# Patient Record
Sex: Female | Born: 1975
Health system: Southern US, Community
[De-identification: ages and names within clinical notes are randomized; demographics above are authoritative.]

## PROBLEM LIST (undated history)

## (undated) DIAGNOSIS — E119 Type 2 diabetes mellitus without complications: Secondary | ICD-10-CM

## (undated) DIAGNOSIS — L209 Atopic dermatitis, unspecified: Secondary | ICD-10-CM

## (undated) DIAGNOSIS — I10 Essential (primary) hypertension: Secondary | ICD-10-CM

## (undated) DIAGNOSIS — E559 Vitamin D deficiency, unspecified: Secondary | ICD-10-CM

## (undated) DIAGNOSIS — F17200 Nicotine dependence, unspecified, uncomplicated: Secondary | ICD-10-CM

## (undated) HISTORY — DX: Type 2 diabetes mellitus without complications: E11.9

## (undated) HISTORY — DX: Vitamin D deficiency, unspecified: E55.9

## (undated) HISTORY — DX: Nicotine dependence, unspecified, uncomplicated: F17.200

## (undated) HISTORY — PX: FINGER GANGLION CYST EXCISION: SHX1636

## (undated) HISTORY — DX: Essential (primary) hypertension: I10

## (undated) HISTORY — DX: Atopic dermatitis, unspecified: L20.9

---
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
## 1998-01-14 ENCOUNTER — Other Ambulatory Visit: Admission: RE | Admit: 1998-01-14 | Discharge: 1998-01-14 | Payer: Self-pay | Admitting: Obstetrics and Gynecology

## 2004-11-07 ENCOUNTER — Ambulatory Visit (HOSPITAL_COMMUNITY): Admission: RE | Admit: 2004-11-07 | Discharge: 2004-11-07 | Payer: Self-pay | Admitting: Obstetrics & Gynecology

## 2004-12-30 ENCOUNTER — Ambulatory Visit (HOSPITAL_COMMUNITY): Admission: RE | Admit: 2004-12-30 | Discharge: 2004-12-30 | Payer: Self-pay | Admitting: Obstetrics & Gynecology

## 2005-03-17 ENCOUNTER — Ambulatory Visit (HOSPITAL_COMMUNITY): Admission: RE | Admit: 2005-03-17 | Discharge: 2005-03-17 | Payer: Self-pay | Admitting: Obstetrics

## 2005-04-08 ENCOUNTER — Inpatient Hospital Stay (HOSPITAL_COMMUNITY): Admission: AD | Admit: 2005-04-08 | Discharge: 2005-04-08 | Payer: Self-pay | Admitting: Obstetrics & Gynecology

## 2005-05-04 ENCOUNTER — Ambulatory Visit (HOSPITAL_COMMUNITY): Admission: RE | Admit: 2005-05-04 | Discharge: 2005-05-04 | Payer: Self-pay | Admitting: Obstetrics

## 2005-05-06 ENCOUNTER — Inpatient Hospital Stay (HOSPITAL_COMMUNITY): Admission: AD | Admit: 2005-05-06 | Discharge: 2005-05-06 | Payer: Self-pay | Admitting: Obstetrics

## 2005-05-11 ENCOUNTER — Ambulatory Visit (HOSPITAL_COMMUNITY): Admission: RE | Admit: 2005-05-11 | Discharge: 2005-05-11 | Payer: Self-pay | Admitting: Obstetrics

## 2005-05-13 ENCOUNTER — Inpatient Hospital Stay (HOSPITAL_COMMUNITY): Admission: AD | Admit: 2005-05-13 | Discharge: 2005-05-16 | Payer: Self-pay | Admitting: Obstetrics

## 2008-09-05 ENCOUNTER — Encounter: Admission: RE | Admit: 2008-09-05 | Discharge: 2008-09-05 | Payer: Self-pay | Admitting: Internal Medicine

## 2010-01-08 ENCOUNTER — Encounter: Admission: RE | Admit: 2010-01-08 | Discharge: 2010-01-29 | Payer: Self-pay | Admitting: Obstetrics and Gynecology

## 2010-01-14 ENCOUNTER — Encounter: Admission: RE | Admit: 2010-01-14 | Discharge: 2010-01-14 | Payer: Self-pay | Admitting: Certified Nurse Midwife

## 2010-03-18 ENCOUNTER — Inpatient Hospital Stay (HOSPITAL_COMMUNITY): Admission: AD | Admit: 2010-03-18 | Discharge: 2010-03-21 | Payer: Self-pay | Admitting: Obstetrics and Gynecology

## 2010-11-01 ENCOUNTER — Encounter: Payer: Self-pay | Admitting: Obstetrics & Gynecology

## 2010-11-02 ENCOUNTER — Encounter: Payer: Self-pay | Admitting: Obstetrics

## 2010-12-29 LAB — COMPREHENSIVE METABOLIC PANEL
ALT: 22 U/L (ref 0–35)
AST: 28 U/L (ref 0–37)
Albumin: 2.3 g/dL — ABNORMAL LOW (ref 3.5–5.2)
Alkaline Phosphatase: 57 U/L (ref 39–117)
BUN: 17 mg/dL (ref 6–23)
CO2: 26 mEq/L (ref 19–32)
Calcium: 9.8 mg/dL (ref 8.4–10.5)
Chloride: 108 mEq/L (ref 96–112)
Creatinine, Ser: 1.04 mg/dL (ref 0.4–1.2)
GFR calc Af Amer: >60 mL/min (ref >60)
GFR calc non Af Amer: >60 mL/min (ref >60)
Glucose, Bld: 73 mg/dL (ref 70–99)
Potassium: 4.1 mEq/L (ref 3.5–5.1)
Sodium: 140 mEq/L (ref 135–145)
Total Bilirubin: 0.2 mg/dL — ABNORMAL LOW (ref 0.3–1.2)
Total Protein: 4.9 g/dL — ABNORMAL LOW (ref 6.0–8.3)

## 2010-12-29 LAB — CBC
HCT: 32.9 % — ABNORMAL LOW (ref 36.0–46.0)
HCT: 40.4 % (ref 36.0–46.0)
Hemoglobin: 11.3 g/dL — ABNORMAL LOW (ref 12.0–15.0)
Hemoglobin: 13.7 g/dL (ref 12.0–15.0)
MCHC: 33.9 g/dL (ref 30.0–36.0)
MCHC: 34.4 g/dL (ref 30.0–36.0)
MCV: 83.1 fL (ref 78.0–100.0)
MCV: 83.5 fL (ref 78.0–100.0)
Platelets: 163 10*3/uL (ref 150–400)
Platelets: 196 10*3/uL (ref 150–400)
RBC: 3.94 MIL/uL (ref 3.87–5.11)
RBC: 4.86 MIL/uL (ref 3.87–5.11)
RDW: 14.7 % (ref 11.5–15.5)
RDW: 15.2 % (ref 11.5–15.5)
WBC: 10.8 10*3/uL — ABNORMAL HIGH (ref 4.0–10.5)
WBC: 13.6 10*3/uL — ABNORMAL HIGH (ref 4.0–10.5)

## 2010-12-29 LAB — GLUCOSE, CAPILLARY
Glucose-Capillary: 179 mg/dL — ABNORMAL HIGH (ref 70–99)
Glucose-Capillary: 80 mg/dL (ref 70–99)

## 2010-12-29 LAB — RPR: RPR Ser Ql: NONREACTIVE

## 2010-12-29 LAB — URIC ACID: Uric Acid, Serum: 6.4 mg/dL (ref 2.4–7.0)

## 2013-09-18 ENCOUNTER — Encounter: Payer: Self-pay | Admitting: Advanced Practice Midwife

## 2014-08-10 ENCOUNTER — Encounter: Payer: Self-pay | Admitting: Family Medicine

## 2014-08-10 ENCOUNTER — Encounter (INDEPENDENT_AMBULATORY_CARE_PROVIDER_SITE_OTHER): Payer: Self-pay

## 2014-08-10 ENCOUNTER — Ambulatory Visit (INDEPENDENT_AMBULATORY_CARE_PROVIDER_SITE_OTHER): Payer: BC Managed Care – PPO | Admitting: Family Medicine

## 2014-08-10 ENCOUNTER — Ambulatory Visit
Admission: RE | Admit: 2014-08-10 | Discharge: 2014-08-10 | Disposition: A | Discharge status: Home / Self Care | Payer: BC Managed Care – PPO | Source: Ambulatory Visit | Attending: Family Medicine | Admitting: Family Medicine

## 2014-08-10 VITALS — BP 126/88 | HR 83 | Ht 65.0 in | Wt 140.0 lb

## 2014-08-10 DIAGNOSIS — G5791 Unspecified mononeuropathy of right lower limb: Secondary | ICD-10-CM

## 2014-08-10 DIAGNOSIS — I10 Essential (primary) hypertension: Secondary | ICD-10-CM

## 2014-08-10 DIAGNOSIS — G5790 Unspecified mononeuropathy of unspecified lower limb: Secondary | ICD-10-CM | POA: Insufficient documentation

## 2014-08-10 LAB — CBC WITH DIFFERENTIAL/PLATELET
Basophils Absolute: 0.1 10*3/uL (ref 0.0–0.1)
Basophils Relative: 1 % (ref 0–1)
Eosinophils Absolute: 0.1 10*3/uL (ref 0.0–0.7)
Eosinophils Relative: 1 % (ref 0–5)
HCT: 43 % (ref 36.0–46.0)
Hemoglobin: 14.1 g/dL (ref 12.0–15.0)
Lymphocytes Relative: 25 % (ref 12–46)
Lymphs Abs: 2 10*3/uL (ref 0.7–4.0)
MCH: 25.8 pg — ABNORMAL LOW (ref 26.0–34.0)
MCHC: 32.8 g/dL (ref 30.0–36.0)
MCV: 78.6 fL (ref 78.0–100.0)
Monocytes Absolute: 0.7 10*3/uL (ref 0.1–1.0)
Monocytes Relative: 9 % (ref 3–12)
Neutro Abs: 5 10*3/uL (ref 1.7–7.7)
Neutrophils Relative %: 64 % (ref 43–77)
Platelets: 294 10*3/uL (ref 150–400)
RBC: 5.47 MIL/uL — ABNORMAL HIGH (ref 3.87–5.11)
RDW: 14.1 % (ref 11.5–15.5)
WBC: 7.8 10*3/uL (ref 4.0–10.5)

## 2014-08-10 LAB — BASIC METABOLIC PANEL
BUN: 15 mg/dL (ref 6–23)
CO2: 22 mEq/L (ref 19–32)
Calcium: 9.5 mg/dL (ref 8.4–10.5)
Chloride: 102 mEq/L (ref 96–112)
Creat: 0.82 mg/dL (ref 0.50–1.10)
Glucose, Bld: 69 mg/dL — ABNORMAL LOW (ref 70–99)
Potassium: 4 mEq/L (ref 3.5–5.3)
Sodium: 139 mEq/L (ref 135–145)

## 2014-08-10 LAB — TSH: TSH: 0.519 u[IU]/mL (ref 0.350–4.500)

## 2014-08-10 MED ORDER — VALACYCLOVIR HCL 1 G PO TABS: 1000.0000 mg | ORAL_TABLET | Freq: Three times a day (TID) | ORAL | Status: DC

## 2014-08-10 MED ORDER — GABAPENTIN 100 MG PO CAPS: ORAL_CAPSULE | ORAL | Status: DC

## 2014-08-10 NOTE — Patient Instructions (Signed)
Take by tablet by mouth Day 1-3:   take one at night  Day 4-6:  Take 2 at night Day 7-10:  take 3 at night Day 11-14:  3 at night, 1 in am Day 15-18:   3 at night, 2 in am Day 19-21: 3 at night, 3 in am Day 22-25: 3 at night, 3 in am, 2 at lunch Day 26 forward : 3 at night, 3 in am, 3 at lunch  I have given you a rx for valtrex in case you break out in a rash

## 2014-08-11 LAB — SEDIMENTATION RATE: Sed Rate: 1 mm/hr (ref 0–22)

## 2014-08-13 ENCOUNTER — Encounter: Payer: Self-pay | Admitting: Family Medicine

## 2014-08-13 NOTE — Assessment & Plan Note (Signed)
Certainly the clinical picture is consistent with some type of neuropathy. Herpes zoster would be most common cause of this but there is no sign of lesion. I did give her a prescription of Valtrex in case she develops rash over the weekend. I will get x-rays to make sure we are not missing some type of  Trauma or bone pathology. I'll start her on gabapentin. I will also check basic labs looking for diabetes, thyroid issues, unusual infectious causes. I will see her back in 2 weeks. She will call or come in if things worsen or she has new symptoms in the meantime. We also discussed continuing use of the compression stocking because I think it is helping with the hyperesthesia, and we discussed possibly using capsaicin cream over-the-counter as a counter irritant

## 2014-08-13 NOTE — Progress Notes (Signed)
Patient ID: Cheryl Hill, female   DOB: 07/30/1976, 38 y.o.   MRN: [id number]  Cheryl RudJennifer G Markes - 38 y.o. female MRN [id number]  Date of birth: 07/04/1976    SUBJECTIVE:     Right anterior shin and right top of the foot pain came on acutely and has continued for the last 4-5 days. No injury that was noted. Has never had anything like this before. Has been wearing a supportive stocking which seems to help some with the hyperesthesia. Has had no rash and noticed no redness. Did seem like the chin was a little bit swollen initially but not as much now. Pain is intense, 9- 10 out of 10, worse with any type of contact with the area.has noted no weakness of the leg. Was seen at urgent care where they did Doppler studies to rule out venous clot. Reportedly those are negative. ROS:     No rash, no unusual weight change, no fever, sweats, chills. She's had no knee swelling no ankle swelling or any unusual arthralgias other than the anterior shin area. She has not been more fatigued than usual, no unusual thirst, no cold or heat intolerance. No unusual bruising, no nausea, no GI upset. No petechiae  PERTINENT  PMH / PSH FH / / SH:  Past Medical, Surgical, Social, and Family History Reviewed & Updated in the EMR.  Pertinent findings include:  She street for hypertension. At one time she had some borderline thyroid functions but is not currently on replacement therapy. Nonsmoker. Her job is sedentary. Never smoker. OBJECTIVE: BP 126/88 mmHg  Pulse 83  Ht 5\' 5"  (1.651 m)  Wt 140 lb (63.504 kg)  BMI 23.30 kg/m2  Physical Exam:  Vital signs are reviewed. GEN.: Well-developed female no acute distress LEG: Bilaterally her lower extremities are symmetrical.her pain is in the right shin and the right foot dorsum and there is no evidence of erythema, no lesions, no rash, no notable swelling. She is hyperesthesia to soft touch sensation in a very discrete location mid shin and then again in a noncontiguous location  on the dorsum of her foot that does not extend into her toes. ANKLE: Full range of motion. Nontender. No effusion. KNEE: No effusion. No erythema. Range of motion flexion distention. Ligamentously intact. No joint line tenderness. The calf is soft. The popliteal space is benign. Notably on her ultrasound she was told she had a mild Baker's cyst but I do not palpate significant fullness in the popliteal space. Neuro: Soft touch sensation in 2 point discrimination in bilateral lower extremities is normal other than a area of hyperesthesia in the mid anterior shin that is an area about 4 cm x 3 cm and then a similar area on the dorsum of the foot not including the ankle and not including the toes and area 2 x 4 cm. VASCULAR: Dorsalis pedis pulses are 2+ bilaterally equal as are posterior tibialis pulses.  ASSESSMENT & PLAN:  See problem based charting & AVS for pt instructions.

## 2014-08-24 ENCOUNTER — Ambulatory Visit (INDEPENDENT_AMBULATORY_CARE_PROVIDER_SITE_OTHER): Payer: BC Managed Care – PPO | Admitting: Family Medicine

## 2014-08-24 ENCOUNTER — Encounter: Payer: Self-pay | Admitting: Family Medicine

## 2014-08-24 VITALS — BP 135/96 | Ht 65.0 in | Wt 140.0 lb

## 2014-08-24 DIAGNOSIS — G5791 Unspecified mononeuropathy of right lower limb: Secondary | ICD-10-CM | POA: Diagnosis not present

## 2014-08-24 NOTE — Assessment & Plan Note (Signed)
Very confusing picture. All of her lab work including her sedimentation rate and x-ray were normal. At this point I would continue to taper the gabapentin up but at a slower rate than we have discussed this. I think the next step would be to do an MRI of the tibial fibular area. As this comes with quite a high co-pay she's a little concerned about pursuing this. We discussed at length. The other alternatives continue to taper the gabapentin and follow this. She's will think about every the weekend and let me know.  Initially thought this was some type of prodromal neuropathy that was likely going be herpes zoster. She has not had any rash however. She does have some soft tissue swelling. Her sedimentation rate was 1 which makes you think this is not rheumatological. Differential includes idiopathic stress fracture, infectious process although I doubt that given her sedimentation rate lack of fever and lack of erythema. Could also include some unusual neurological conditions which multiple sclerosis is always a component.

## 2014-08-24 NOTE — Progress Notes (Signed)
   Subjective:    Patient ID: Cheryl Hill, female    DOB: 04/26/1976, 38 y.o.   MRN: [id number]  HPI Follow-up right lower extremity pain. She did not break out in a rash. Her symptoms have not resolved. She has continued to use the compression stocking which gives her some mild relief from the hyperesthesia. She has tapered the gabapentin up to 55 mg a day is having some dizziness with that. She's had no other unusual symptoms.   Review of Systems No fever, sweats, chills. No other arthralgias or myalgias. No unusual weight loss.    Objective:   Physical Exam Vital signs are reviewed GENERAL: Well-developed female no acute distress EXTREMITY: Right lower leg is tender to palpation over the shin. There is no deformity. There is some mild soft tissue swelling there. The dorsum of the foot is quite swollen today and she has hyperesthesia there as well. She is also tender palpation along the posterior tibial tendon but she has intact strength. Calf is soft. She has 4 range of motion of the knee and ankle and midfoot.       Assessment & Plan:

## 2014-08-31 ENCOUNTER — Ambulatory Visit: Payer: BC Managed Care – PPO | Admitting: Family Medicine

## 2014-09-11 ENCOUNTER — Ambulatory Visit: Payer: Medicaid Other | Admitting: Obstetrics

## 2015-03-27 ENCOUNTER — Encounter: Payer: Self-pay | Admitting: *Deleted

## 2015-04-24 ENCOUNTER — Encounter: Payer: Self-pay | Admitting: Obstetrics & Gynecology

## 2015-08-15 ENCOUNTER — Encounter: Payer: Self-pay | Admitting: Family Medicine

## 2015-11-22 ENCOUNTER — Encounter: Payer: Self-pay | Admitting: Obstetrics & Gynecology

## 2015-12-18 ENCOUNTER — Encounter: Payer: Self-pay | Admitting: Obstetrics & Gynecology

## 2016-02-03 DIAGNOSIS — L74510 Primary focal hyperhidrosis, axilla: Secondary | ICD-10-CM | POA: Diagnosis not present

## 2016-02-03 DIAGNOSIS — I10 Essential (primary) hypertension: Secondary | ICD-10-CM | POA: Diagnosis not present

## 2016-02-03 DIAGNOSIS — L219 Seborrheic dermatitis, unspecified: Secondary | ICD-10-CM | POA: Diagnosis not present

## 2016-02-03 DIAGNOSIS — Z0000 Encounter for general adult medical examination without abnormal findings: Secondary | ICD-10-CM | POA: Diagnosis not present

## 2016-02-10 ENCOUNTER — Other Ambulatory Visit: Payer: Self-pay

## 2016-02-10 DIAGNOSIS — Z1231 Encounter for screening mammogram for malignant neoplasm of breast: Secondary | ICD-10-CM

## 2016-02-17 DIAGNOSIS — L74510 Primary focal hyperhidrosis, axilla: Secondary | ICD-10-CM | POA: Diagnosis not present

## 2016-02-17 DIAGNOSIS — L219 Seborrheic dermatitis, unspecified: Secondary | ICD-10-CM | POA: Diagnosis not present

## 2016-03-30 DIAGNOSIS — L74510 Primary focal hyperhidrosis, axilla: Secondary | ICD-10-CM | POA: Diagnosis not present

## 2016-03-30 DIAGNOSIS — L219 Seborrheic dermatitis, unspecified: Secondary | ICD-10-CM | POA: Diagnosis not present

## 2016-03-30 DIAGNOSIS — L309 Dermatitis, unspecified: Secondary | ICD-10-CM | POA: Diagnosis not present

## 2016-06-01 ENCOUNTER — Ambulatory Visit: Payer: Self-pay

## 2016-06-08 ENCOUNTER — Ambulatory Visit: Payer: Self-pay

## 2016-06-08 ENCOUNTER — Ambulatory Visit
Admission: RE | Admit: 2016-06-08 | Discharge: 2016-06-08 | Disposition: A | Discharge status: Home / Self Care | Payer: BLUE CROSS/BLUE SHIELD | Source: Ambulatory Visit

## 2016-06-08 DIAGNOSIS — Z1231 Encounter for screening mammogram for malignant neoplasm of breast: Secondary | ICD-10-CM | POA: Diagnosis not present

## 2016-06-18 ENCOUNTER — Other Ambulatory Visit: Payer: Self-pay | Admitting: Internal Medicine

## 2016-06-18 DIAGNOSIS — R928 Other abnormal and inconclusive findings on diagnostic imaging of breast: Secondary | ICD-10-CM

## 2016-06-23 ENCOUNTER — Ambulatory Visit
Admission: RE | Admit: 2016-06-23 | Discharge: 2016-06-23 | Disposition: A | Discharge status: Home / Self Care | Payer: BLUE CROSS/BLUE SHIELD | Source: Ambulatory Visit | Attending: Internal Medicine | Admitting: Internal Medicine

## 2016-06-23 ENCOUNTER — Other Ambulatory Visit: Payer: Self-pay

## 2016-06-23 DIAGNOSIS — R928 Other abnormal and inconclusive findings on diagnostic imaging of breast: Secondary | ICD-10-CM

## 2016-06-23 DIAGNOSIS — D3131 Benign neoplasm of right choroid: Secondary | ICD-10-CM | POA: Diagnosis not present

## 2016-06-23 DIAGNOSIS — R922 Inconclusive mammogram: Secondary | ICD-10-CM | POA: Diagnosis not present

## 2016-07-06 DIAGNOSIS — J00 Acute nasopharyngitis [common cold]: Secondary | ICD-10-CM | POA: Diagnosis not present

## 2016-07-06 DIAGNOSIS — I10 Essential (primary) hypertension: Secondary | ICD-10-CM | POA: Diagnosis not present

## 2016-07-06 DIAGNOSIS — J029 Acute pharyngitis, unspecified: Secondary | ICD-10-CM | POA: Diagnosis not present

## 2016-07-17 DIAGNOSIS — Z3202 Encounter for pregnancy test, result negative: Secondary | ICD-10-CM | POA: Diagnosis not present

## 2016-07-17 DIAGNOSIS — Z30017 Encounter for initial prescription of implantable subdermal contraceptive: Secondary | ICD-10-CM | POA: Diagnosis not present

## 2016-07-17 DIAGNOSIS — Z6823 Body mass index (BMI) 23.0-23.9, adult: Secondary | ICD-10-CM | POA: Diagnosis not present

## 2016-07-17 DIAGNOSIS — Z01419 Encounter for gynecological examination (general) (routine) without abnormal findings: Secondary | ICD-10-CM | POA: Diagnosis not present

## 2016-07-22 DIAGNOSIS — Z23 Encounter for immunization: Secondary | ICD-10-CM | POA: Diagnosis not present

## 2016-08-10 DIAGNOSIS — L219 Seborrheic dermatitis, unspecified: Secondary | ICD-10-CM | POA: Diagnosis not present

## 2016-08-10 DIAGNOSIS — R7309 Other abnormal glucose: Secondary | ICD-10-CM | POA: Diagnosis not present

## 2016-08-10 DIAGNOSIS — I10 Essential (primary) hypertension: Secondary | ICD-10-CM | POA: Diagnosis not present

## 2016-08-10 DIAGNOSIS — M79671 Pain in right foot: Secondary | ICD-10-CM | POA: Diagnosis not present

## 2016-08-10 DIAGNOSIS — H5213 Myopia, bilateral: Secondary | ICD-10-CM | POA: Diagnosis not present

## 2016-08-10 DIAGNOSIS — H16103 Unspecified superficial keratitis, bilateral: Secondary | ICD-10-CM | POA: Diagnosis not present

## 2016-08-10 DIAGNOSIS — H04123 Dry eye syndrome of bilateral lacrimal glands: Secondary | ICD-10-CM | POA: Diagnosis not present

## 2016-09-18 DIAGNOSIS — M65872 Other synovitis and tenosynovitis, left ankle and foot: Secondary | ICD-10-CM | POA: Diagnosis not present

## 2016-09-18 DIAGNOSIS — M65871 Other synovitis and tenosynovitis, right ankle and foot: Secondary | ICD-10-CM | POA: Diagnosis not present

## 2016-11-04 DIAGNOSIS — J111 Influenza due to unidentified influenza virus with other respiratory manifestations: Secondary | ICD-10-CM | POA: Diagnosis not present

## 2016-11-10 DIAGNOSIS — L209 Atopic dermatitis, unspecified: Secondary | ICD-10-CM

## 2016-11-10 HISTORY — DX: Atopic dermatitis, unspecified: L20.9

## 2016-12-08 ENCOUNTER — Ambulatory Visit (INDEPENDENT_AMBULATORY_CARE_PROVIDER_SITE_OTHER): Payer: BLUE CROSS/BLUE SHIELD | Admitting: Obstetrics and Gynecology

## 2016-12-08 ENCOUNTER — Encounter: Payer: Self-pay | Admitting: Obstetrics and Gynecology

## 2016-12-08 VITALS — BP 172/96 | HR 90 | Temp 97.5°F | Wt 117.0 lb

## 2016-12-08 DIAGNOSIS — I10 Essential (primary) hypertension: Secondary | ICD-10-CM

## 2016-12-08 DIAGNOSIS — Z202 Contact with and (suspected) exposure to infections with a predominantly sexual mode of transmission: Secondary | ICD-10-CM | POA: Insufficient documentation

## 2016-12-08 DIAGNOSIS — Z0000 Encounter for general adult medical examination without abnormal findings: Secondary | ICD-10-CM | POA: Diagnosis not present

## 2016-12-08 DIAGNOSIS — Z01419 Encounter for gynecological examination (general) (routine) without abnormal findings: Secondary | ICD-10-CM | POA: Diagnosis not present

## 2016-12-08 NOTE — Progress Notes (Signed)
Patient presents for her Annual exam.  Cheryl Hill is a 41 y.o.  She has no GYN complaints except for a rash involving her groin. States has had before and was prescribed a cream. Rash went away but has returned a few months ago. Denies any exposure and only occasional itching.  She is sexual active with same partner x 5 years. No contraception. Monthly cycles last 3 days. Last pap over 10 yrs ago. Denies H/O abnormal pap smear or STD's  [id number] EAB x 2 TSVD x 1 and C section x 1  HTN and is followed by PCP. Has not taken her BP meds for the last 2 days. No particular reason.      Denies abnormal vaginal bleeding, discharge, pelvic pain, problems with intercourse or other gynecologic concerns.    Gynecologic History Patient's last menstrual period was 11/13/2016 (exact date). Contraception: none Last Pap: over 10 yrs ago. Results were: normal Last mammogram: NA. Results were: NA  Obstetric History OB History  No data available    Past Medical History:  Diagnosis Date  . Atopic dermatitis 11/10/2016  . Diabetes mellitus without complication (HCC)   . Hypertension     No past surgical history on file.  Current Outpatient Prescriptions on File Prior to Visit  Medication Sig Dispense Refill  . lisinopril (PRINIVIL,ZESTRIL) 10 MG tablet Take 1 mg by mouth daily.    . sitaGLIPtin (JANUVIA) 100 MG tablet Take 100 mg by mouth daily.    Marland Kitchen aspirin 81 MG tablet Take 81 mg by mouth 2 (two) times daily.    Marland Kitchen gabapentin (NEURONTIN) 100 MG capsule Take by mouth patient has written taper (Patient not taking: Reported on 12/08/2016) 90 capsule 0  . losartan (COZAAR) 25 MG tablet Take 25 mg by mouth daily.    Maudry Mayhew AD 4-10-325 MG TABS   0  . traMADol (ULTRAM) 50 MG tablet   0  . valACYclovir (VALTREX) 1000 MG tablet Take 1 tablet (1,000 mg total) by mouth 3 (three) times daily. (Patient not taking: Reported on 12/08/2016) 21 tablet 0  . Vitamin D, Ergocalciferol, (DRISDOL) 50000 units  CAPS capsule Take 50,000 Units by mouth every 7 (seven) days.     No current facility-administered medications on file prior to visit.     Allergies  Allergen Reactions  . Fish Allergy Itching, Nausea And Vomiting and Swelling    Social History   Social History  . Marital status: Married    Spouse name: N/A  . Number of children: N/A  . Years of education: N/A   Occupational History  . Not on file.   Social History Main Topics  . Smoking status: Current Every Day Smoker    Types: Cigarettes  . Smokeless tobacco: Never Used  . Alcohol use 0.6 oz/week    1 Cans of beer per week  . Drug use: No  . Sexual activity: Not on file   Other Topics Concern  . Not on file   Social History Narrative  . No narrative on file    Family History  Problem Relation Age of Onset  . Diabetes Mother   . Cancer Brother     The following portions of the patient's history were reviewed and updated as appropriate: allergies, current medications, past family history, past medical history, past social history, past surgical history and problem list.  Review of Systems Pertinent items noted in HPI and remainder of comprehensive ROS otherwise negative.   Objective:  BP Marland Kitchen)  172/96   Pulse 90   Temp 97.5 F (36.4 C)   Wt 117 lb (53.1 kg)   LMP 11/13/2016 (Exact Date)   BMI 19.47 kg/m  CONSTITUTIONAL: Well-developed, well-nourished female in no acute distress.  HENT:  Normocephalic, atraumatic, External right and left ear normal. Oropharynx is clear and moist EYES: Conjunctivae and EOM are normal. Pupils are equal, round, and reactive to light. No scleral icterus.  NECK: Normal range of motion, supple, no masses.  Normal thyroid.  SKIN: Skin is warm and dry. No rash noted. Not diaphoretic. No erythema. No pallor. Rash gorin region compatible with tenia NEUROLGIC: Alert and oriented to person, place, and time. Normal reflexes, muscle tone coordination. No cranial nerve deficit  noted. PSYCHIATRIC: Normal mood and affect. Normal behavior. Normal judgment and thought content. CARDIOVASCULAR: Normal heart rate noted, regular rhythm RESPIRATORY: Clear to auscultation bilaterally. Effort and breath sounds normal, no problems with respiration noted. BREASTS: Symmetric in size. No masses, skin changes, nipple drainage, or lymphadenopathy. ABDOMEN: Soft, normal bowel sounds, no distention noted.  No tenderness, rebound or guarding.  PELVIC: Normal appearing external genitalia; normal appearing vaginal mucosa and cervix.  No abnormal discharge noted.  Pap smear obtained.  Normal uterine size, no other palpable masses, no uterine or adnexal tenderness. MUSCULOSKELETAL: Normal range of motion. No tenderness.  No cyanosis, clubbing, or edema.  2+ distal pulses.   Assessment:  Annual gynecologic examination with pap smear HTN STD exposure Tinea   Plan:  Will follow up results of pap smear and manage accordingly. STD testing ordered as per pt request.  Mammogram scheduled Lotrisone AF for tinea. Importance of taking BP medication discussed with pt. Routine preventative health maintenance measures emphasized. Please refer to After Visit Summary for other counseling recommendations.    Hermina StaggersMichael L Johnanthony Wilden, MD, FACOG Attending Obstetrician & Gynecologist Center [facility], [facility]

## 2016-12-08 NOTE — Patient Instructions (Signed)
Health Maintenance, Female Adopting a healthy lifestyle and getting preventive care can go a long way to promote health and wellness. Talk with your health care provider about what schedule of regular examinations is right for you. This is a good chance for you to check in with your provider about disease prevention and staying healthy. In between checkups, there are plenty of things you can do on your own. Experts have done a lot of research about which lifestyle changes and preventive measures are most likely to keep you healthy. Ask your health care provider for more information. Weight and diet Eat a healthy diet  Be sure to include plenty of vegetables, fruits, low-fat dairy products, and lean protein.  Do not eat a lot of foods high in solid fats, added sugars, or salt.  Get regular exercise. This is one of the most important things you can do for your health.  Most adults should exercise for at least 150 minutes each week. The exercise should increase your heart rate and make you sweat (moderate-intensity exercise).  Most adults should also do strengthening exercises at least twice a week. This is in addition to the moderate-intensity exercise. Maintain a healthy weight  Body mass index (BMI) is a measurement that can be used to identify possible weight problems. It estimates body fat based on height and weight. Your health care provider can help determine your BMI and help you achieve or maintain a healthy weight.  For females 76 years of age and older:  A BMI below 18.5 is considered underweight.  A BMI of 18.5 to 24.9 is normal.  A BMI of 25 to 29.9 is considered overweight.  A BMI of 30 and above is considered obese. Watch levels of cholesterol and blood lipids  You should start having your blood tested for lipids and cholesterol at 41 years of age, then have this test every 5 years.  You may need to have your cholesterol levels checked more often if:  Your lipid or  cholesterol levels are high.  You are older than 41 years of age.  You are at high risk for heart disease. Cancer screening Lung Cancer  Lung cancer screening is recommended for adults 64-42 years old who are at high risk for lung cancer because of a history of smoking.  A yearly low-dose CT scan of the lungs is recommended for people who:  Currently smoke.  Have quit within the past 15 years.  Have at least a 30-pack-year history of smoking. A pack year is smoking an average of one pack of cigarettes a day for 1 year.  Yearly screening should continue until it has been 15 years since you quit.  Yearly screening should stop if you develop a health problem that would prevent you from having lung cancer treatment. Breast Cancer  Practice breast self-awareness. This means understanding how your breasts normally appear and feel.  It also means doing regular breast self-exams. Let your health care provider know about any changes, no matter how small.  If you are in your 20s or 30s, you should have a clinical breast exam (CBE) by a health care provider every 1-3 years as part of a regular health exam.  If you are 34 or older, have a CBE every year. Also consider having a breast X-ray (mammogram) every year.  If you have a family history of breast cancer, talk to your health care provider about genetic screening.  If you are at high risk for breast cancer, talk  to your health care provider about having an MRI and a mammogram every year.  Breast cancer gene (BRCA) assessment is recommended for women who have family members with BRCA-related cancers. BRCA-related cancers include:  Breast.  Ovarian.  Tubal.  Peritoneal cancers.  Results of the assessment will determine the need for genetic counseling and BRCA1 and BRCA2 testing. Cervical Cancer  Your health care provider may recommend that you be screened regularly for cancer of the pelvic organs (ovaries, uterus, and vagina).  This screening involves a pelvic examination, including checking for microscopic changes to the surface of your cervix (Pap test). You may be encouraged to have this screening done every 3 years, beginning at age 24.  For women ages 66-65, health care providers may recommend pelvic exams and Pap testing every 3 years, or they may recommend the Pap and pelvic exam, combined with testing for human papilloma virus (HPV), every 5 years. Some types of HPV increase your risk of cervical cancer. Testing for HPV may also be done on women of any age with unclear Pap test results.  Other health care providers may not recommend any screening for nonpregnant women who are considered low risk for pelvic cancer and who do not have symptoms. Ask your health care provider if a screening pelvic exam is right for you.  If you have had past treatment for cervical cancer or a condition that could lead to cancer, you need Pap tests and screening for cancer for at least 20 years after your treatment. If Pap tests have been discontinued, your risk factors (such as having a new sexual partner) need to be reassessed to determine if screening should resume. Some women have medical problems that increase the chance of getting cervical cancer. In these cases, your health care provider may recommend more frequent screening and Pap tests. Colorectal Cancer  This type of cancer can be detected and often prevented.  Routine colorectal cancer screening usually begins at 41 years of age and continues through 41 years of age.  Your health care provider may recommend screening at an earlier age if you have risk factors for colon cancer.  Your health care provider may also recommend using home test kits to check for hidden blood in the stool.  A small camera at the end of a tube can be used to examine your colon directly (sigmoidoscopy or colonoscopy). This is done to check for the earliest forms of colorectal cancer.  Routine  screening usually begins at age 41.  Direct examination of the colon should be repeated every 5-10 years through 41 years of age. However, you may need to be screened more often if early forms of precancerous polyps or small growths are found. Skin Cancer  Check your skin from head to toe regularly.  Tell your health care provider about any new moles or changes in moles, especially if there is a change in a mole's shape or color.  Also tell your health care provider if you have a mole that is larger than the size of a pencil eraser.  Always use sunscreen. Apply sunscreen liberally and repeatedly throughout the day.  Protect yourself by wearing long sleeves, pants, a wide-brimmed hat, and sunglasses whenever you are outside. Heart disease, diabetes, and high blood pressure  High blood pressure causes heart disease and increases the risk of stroke. High blood pressure is more likely to develop in:  People who have blood pressure in the high end of the normal range (130-139/85-89 mm Hg).  People who are overweight or obese.  People who are African American.  If you are 59-24 years of age, have your blood pressure checked every 3-5 years. If you are 34 years of age or older, have your blood pressure checked every year. You should have your blood pressure measured twice-once when you are at a hospital or clinic, and once when you are not at a hospital or clinic. Record the average of the two measurements. To check your blood pressure when you are not at a hospital or clinic, you can use:  An automated blood pressure machine at a pharmacy.  A home blood pressure monitor.  If you are between 29 years and 60 years old, ask your health care provider if you should take aspirin to prevent strokes.  Have regular diabetes screenings. This involves taking a blood sample to check your fasting blood sugar level.  If you are at a normal weight and have a low risk for diabetes, have this test once  every three years after 41 years of age.  If you are overweight and have a high risk for diabetes, consider being tested at a younger age or more often. Preventing infection Hepatitis B  If you have a higher risk for hepatitis B, you should be screened for this virus. You are considered at high risk for hepatitis B if:  You were born in a country where hepatitis B is common. Ask your health care provider which countries are considered high risk.  Your parents were born in a high-risk country, and you have not been immunized against hepatitis B (hepatitis B vaccine).  You have HIV or AIDS.  You use needles to inject street drugs.  You live with someone who has hepatitis B.  You have had sex with someone who has hepatitis B.  You get hemodialysis treatment.  You take certain medicines for conditions, including cancer, organ transplantation, and autoimmune conditions. Hepatitis C  Blood testing is recommended for:  Everyone born from [birth year] through 1965.  Anyone with known risk factors for hepatitis C. Sexually transmitted infections (STIs)  You should be screened for sexually transmitted infections (STIs) including gonorrhea and chlamydia if:  You are sexually active and are younger than 41 years of age.  You are older than 41 years of age and your health care provider tells you that you are at risk for this type of infection.  Your sexual activity has changed since you were last screened and you are at an increased risk for chlamydia or gonorrhea. Ask your health care provider if you are at risk.  If you do not have HIV, but are at risk, it may be recommended that you take a prescription medicine daily to prevent HIV infection. This is called pre-exposure prophylaxis (PrEP). You are considered at risk if:  You are sexually active and do not regularly use condoms or know the HIV status of your partner(s).  You take drugs by injection.  You are sexually active with a partner  who has HIV. Talk with your health care provider about whether you are at high risk of being infected with HIV. If you choose to begin PrEP, you should first be tested for HIV. You should then be tested every 3 months for as long as you are taking PrEP. Pregnancy  If you are premenopausal and you may become pregnant, ask your health care provider about preconception counseling.  If you may become pregnant, take 400 to 800 micrograms (mcg) of folic acid  every day.  If you want to prevent pregnancy, talk to your health care provider about birth control (contraception). Osteoporosis and menopause  Osteoporosis is a disease in which the bones lose minerals and strength with aging. This can result in serious bone fractures. Your risk for osteoporosis can be identified using a bone density scan.  If you are 4 years of age or older, or if you are at risk for osteoporosis and fractures, ask your health care provider if you should be screened.  Ask your health care provider whether you should take a calcium or vitamin D supplement to lower your risk for osteoporosis.  Menopause may have certain physical symptoms and risks.  Hormone replacement therapy may reduce some of these symptoms and risks. Talk to your health care provider about whether hormone replacement therapy is right for you. Follow these instructions at home:  Schedule regular health, dental, and eye exams.  Stay current with your immunizations.  Do not use any tobacco products including cigarettes, chewing tobacco, or electronic cigarettes.  If you are pregnant, do not drink alcohol.  If you are breastfeeding, limit how much and how often you drink alcohol.  Limit alcohol intake to no more than 1 drink per day for nonpregnant women. One drink equals 12 ounces of beer, 5 ounces of wine, or 1 ounces of hard liquor.  Do not use street drugs.  Do not share needles.  Ask your health care provider for help if you need support  or information about quitting drugs.  Tell your health care provider if you often feel depressed.  Tell your health care provider if you have ever been abused or do not feel safe at home. This information is not intended to replace advice given to you by your health care provider. Make sure you discuss any questions you have with your health care provider. Document Released: 04/13/2011 Document Revised: 03/05/2016 Document Reviewed: 07/02/2015 Elsevier Interactive Patient Education  2017 [employer].

## 2016-12-09 LAB — CYTOLOGY - PAP
Candida vaginitis: NEGATIVE
Chlamydia: NEGATIVE
Diagnosis: NEGATIVE
HPV: NOT DETECTED
Neisseria Gonorrhea: NEGATIVE
Trichomonas: NEGATIVE

## 2016-12-14 ENCOUNTER — Other Ambulatory Visit: Payer: BLUE CROSS/BLUE SHIELD

## 2016-12-14 DIAGNOSIS — Z202 Contact with and (suspected) exposure to infections with a predominantly sexual mode of transmission: Secondary | ICD-10-CM | POA: Diagnosis not present

## 2016-12-14 DIAGNOSIS — Z0000 Encounter for general adult medical examination without abnormal findings: Secondary | ICD-10-CM | POA: Diagnosis not present

## 2016-12-16 ENCOUNTER — Other Ambulatory Visit: Payer: BLUE CROSS/BLUE SHIELD

## 2016-12-18 LAB — HIV ANTIBODY (ROUTINE TESTING W REFLEX): HIV Screen 4th Generation wRfx: NONREACTIVE

## 2016-12-18 LAB — HEPATITIS C ANTIBODY: Hep C Virus Ab: <0.1 s/co ratio (ref 0.0–0.9)

## 2016-12-18 LAB — RPR: RPR Ser Ql: NONREACTIVE

## 2016-12-18 LAB — HEPATITIS B SURFACE ANTIGEN: Hepatitis B Surface Ag: NEGATIVE

## 2016-12-21 ENCOUNTER — Telehealth: Payer: Self-pay

## 2016-12-21 NOTE — Telephone Encounter (Signed)
Contacted and advised of results.

## 2016-12-23 DIAGNOSIS — D3131 Benign neoplasm of right choroid: Secondary | ICD-10-CM | POA: Diagnosis not present

## 2016-12-30 ENCOUNTER — Other Ambulatory Visit: Payer: Self-pay | Admitting: Obstetrics and Gynecology

## 2016-12-30 DIAGNOSIS — Z1231 Encounter for screening mammogram for malignant neoplasm of breast: Secondary | ICD-10-CM

## 2017-03-10 DIAGNOSIS — R402441 Other coma, without documented Glasgow coma scale score, or with partial score reported, in the field [EMT or ambulance]: Secondary | ICD-10-CM | POA: Diagnosis not present

## 2017-03-31 DIAGNOSIS — I10 Essential (primary) hypertension: Secondary | ICD-10-CM | POA: Diagnosis not present

## 2017-03-31 DIAGNOSIS — Z0000 Encounter for general adult medical examination without abnormal findings: Secondary | ICD-10-CM | POA: Diagnosis not present

## 2017-03-31 DIAGNOSIS — Z1389 Encounter for screening for other disorder: Secondary | ICD-10-CM | POA: Diagnosis not present

## 2017-03-31 DIAGNOSIS — Z23 Encounter for immunization: Secondary | ICD-10-CM | POA: Diagnosis not present

## 2017-08-03 DIAGNOSIS — Z23 Encounter for immunization: Secondary | ICD-10-CM | POA: Diagnosis not present

## 2017-08-19 DIAGNOSIS — H04123 Dry eye syndrome of bilateral lacrimal glands: Secondary | ICD-10-CM | POA: Diagnosis not present

## 2017-08-19 DIAGNOSIS — H16103 Unspecified superficial keratitis, bilateral: Secondary | ICD-10-CM | POA: Diagnosis not present

## 2017-08-19 DIAGNOSIS — H5213 Myopia, bilateral: Secondary | ICD-10-CM | POA: Diagnosis not present

## 2017-08-19 DIAGNOSIS — D3131 Benign neoplasm of right choroid: Secondary | ICD-10-CM | POA: Diagnosis not present

## 2017-08-23 DIAGNOSIS — Z6824 Body mass index (BMI) 24.0-24.9, adult: Secondary | ICD-10-CM | POA: Diagnosis not present

## 2017-08-23 DIAGNOSIS — Z01419 Encounter for gynecological examination (general) (routine) without abnormal findings: Secondary | ICD-10-CM | POA: Diagnosis not present

## 2017-09-29 DIAGNOSIS — Z6824 Body mass index (BMI) 24.0-24.9, adult: Secondary | ICD-10-CM | POA: Diagnosis not present

## 2017-09-29 DIAGNOSIS — Z79899 Other long term (current) drug therapy: Secondary | ICD-10-CM | POA: Diagnosis not present

## 2017-09-29 DIAGNOSIS — I10 Essential (primary) hypertension: Secondary | ICD-10-CM | POA: Diagnosis not present

## 2017-09-29 DIAGNOSIS — R42 Dizziness and giddiness: Secondary | ICD-10-CM | POA: Diagnosis not present

## 2017-09-29 DIAGNOSIS — D3131 Benign neoplasm of right choroid: Secondary | ICD-10-CM | POA: Diagnosis not present

## 2018-01-05 DIAGNOSIS — Z79899 Other long term (current) drug therapy: Secondary | ICD-10-CM | POA: Diagnosis not present

## 2018-01-05 DIAGNOSIS — I10 Essential (primary) hypertension: Secondary | ICD-10-CM | POA: Diagnosis not present

## 2018-04-07 DIAGNOSIS — Z0000 Encounter for general adult medical examination without abnormal findings: Secondary | ICD-10-CM | POA: Diagnosis not present

## 2018-04-07 DIAGNOSIS — I10 Essential (primary) hypertension: Secondary | ICD-10-CM | POA: Diagnosis not present

## 2018-04-19 ENCOUNTER — Other Ambulatory Visit: Payer: Self-pay | Admitting: Internal Medicine

## 2018-04-19 DIAGNOSIS — Z1231 Encounter for screening mammogram for malignant neoplasm of breast: Secondary | ICD-10-CM

## 2018-05-10 ENCOUNTER — Ambulatory Visit: Payer: BLUE CROSS/BLUE SHIELD

## 2018-05-21 DIAGNOSIS — J0140 Acute pansinusitis, unspecified: Secondary | ICD-10-CM | POA: Diagnosis not present

## 2018-05-27 ENCOUNTER — Ambulatory Visit: Payer: Self-pay | Admitting: Family Medicine

## 2018-06-07 ENCOUNTER — Encounter: Payer: Self-pay | Admitting: Family Medicine

## 2018-06-07 ENCOUNTER — Ambulatory Visit: Payer: 59 | Admitting: Family Medicine

## 2018-06-07 VITALS — BP 120/70 | HR 88 | Ht 64.0 in | Wt 125.0 lb

## 2018-06-07 DIAGNOSIS — E119 Type 2 diabetes mellitus without complications: Secondary | ICD-10-CM

## 2018-06-07 DIAGNOSIS — Z794 Long term (current) use of insulin: Secondary | ICD-10-CM

## 2018-06-07 DIAGNOSIS — E11649 Type 2 diabetes mellitus with hypoglycemia without coma: Secondary | ICD-10-CM

## 2018-06-07 DIAGNOSIS — I10 Essential (primary) hypertension: Secondary | ICD-10-CM | POA: Diagnosis not present

## 2018-06-07 DIAGNOSIS — E559 Vitamin D deficiency, unspecified: Secondary | ICD-10-CM

## 2018-06-07 DIAGNOSIS — R5383 Other fatigue: Secondary | ICD-10-CM

## 2018-06-07 LAB — POCT URINALYSIS DIP (PROADVANTAGE DEVICE)
Bilirubin, UA: NEGATIVE
Ketones, POC UA: NEGATIVE mg/dL
Leukocytes, UA: NEGATIVE
Nitrite, UA: NEGATIVE
Protein Ur, POC: NEGATIVE mg/dL
SPECIFIC GRAVITY, URINE: 1.025
UUROB: NEGATIVE
pH, UA: 6 (ref 5.0–8.0)

## 2018-06-07 LAB — POCT GLYCOSYLATED HEMOGLOBIN (HGB A1C): HEMOGLOBIN A1C: 7.9 % — AB (ref 4.0–5.6)

## 2018-06-07 NOTE — Patient Instructions (Addendum)
Cut back your Lantus from 10 units to 5 units daily.   Be careful with your mealtime insulin doses.   Eat small frequent meals with protein.   You will receive a call from the endocrinologist. Take your blood sugar readings into to see them.    Hypoglycemia Hypoglycemia is when the sugar (glucose) level in the blood is too low. Symptoms of low blood sugar may include:  Feeling: ? Hungry. ? Worried or nervous (anxious). ? Sweaty and clammy. ? Confused. ? Dizzy. ? Sleepy. ? Sick to your stomach (nauseous).  Having: ? A fast heartbeat. ? A headache. ? A change in your vision. ? Jerky movements that you cannot control (seizure). ? Nightmares. ? Tingling or no feeling (numbness) around the mouth, lips, or tongue.  Having trouble with: ? Talking. ? Paying attention (concentrating). ? Moving (coordination). ? Sleeping.  Shaking.  Passing out (fainting).  Getting upset easily (irritability).  Low blood sugar can happen to people who have diabetes and people who do not have diabetes. Low blood sugar can happen quickly, and it can be an emergency. Treating Low Blood Sugar Low blood sugar is often treated by eating or drinking something sugary right away. If you can think clearly and swallow safely, follow the 15:15 rule:  Take 15 grams of a fast-acting carb (carbohydrate). Some fast-acting carbs are: ? 1 tube of glucose gel. ? 3 sugar tablets (glucose pills). ? 6-8 pieces of hard candy. ? 4 oz (120 mL) of fruit juice. ? 4 oz (120 mL) of regular (not diet) soda.  Check your blood sugar 15 minutes after you take the carb.  If your blood sugar is still at or below 70 mg/dL (3.9 mmol/L), take 15 grams of a carb again.  If your blood sugar does not go above 70 mg/dL (3.9 mmol/L) after 3 tries, get help right away.  After your blood sugar goes back to normal, eat a meal or a snack within 1 hour.  Treating Very Low Blood Sugar If your blood sugar is at or below 54 mg/dL (3  mmol/L), you have very low blood sugar (severe hypoglycemia). This is an emergency. Do not wait to see if the symptoms will go away. Get medical help right away. Call your local emergency services (911 in the [place] Do not drive yourself to the hospital. If you have very low blood sugar and you cannot eat or drink, you may need a glucagon shot (injection). A family member or friend should learn how to check your blood sugar and how to give you a glucagon shot. Ask your doctor if you need to have a glucagon shot kit at home. Follow these instructions at home: General instructions  Avoid any diets that cause you to not eat enough food. Talk with your doctor before you start any new diet.  Take over-the-counter and prescription medicines only as told by your doctor.  Limit alcohol to no more than 1 drink per day for nonpregnant women and 2 drinks per day for men. One drink equals 12 oz of beer, 5 oz of wine, or 1 oz of hard liquor.  Keep all follow-up visits as told by your doctor. This is important. If You Have Diabetes:   Make sure you know the symptoms of low blood sugar.  Always keep a source of sugar with you, such as: ? Sugar. ? Sugar tablets. ? Glucose gel. ? Fruit juice. ? Regular soda (not diet soda). ? Milk. ? Hard candy. ? Honey.  Take your medicines as told.  Follow your exercise and meal plan. ? Eat on time. Do not skip meals. ? Follow your sick day plan when you cannot eat or drink normally. Make this plan ahead of time with your doctor.  Check your blood sugar as often as told by your doctor. Always check before and after exercise.  Share your diabetes care plan with: ? Your work or school. ? People you live with.  Check your pee (urine) for ketones: ? When you are sick. ? As told by your doctor.  Carry a card or wear jewelry that says you have diabetes. If You Have Low Blood Sugar From Other Causes:   Check your blood sugar as often as told by your  doctor.  Follow instructions from your doctor about what you cannot eat or drink. Contact a doctor if:  You have trouble keeping your blood sugar in your target range.  You have low blood sugar often. Get help right away if:  You still have symptoms after you eat or drink something sugary.  Your blood sugar is at or below 54 mg/dL (3 mmol/L).  You have jerky movements that you cannot control.  You pass out. These symptoms may be an emergency. Do not wait to see if the symptoms will go away. Get medical help right away. Call your local emergency services (911 in the [place] Do not drive yourself to the hospital. This information is not intended to replace advice given to you by your health care provider. Make sure you discuss any questions you have with your health care provider. Document Released: 12/23/2009 Document Revised: 03/05/2016 Document Reviewed: 11/01/2015 Elsevier Interactive Patient Education  [employer].

## 2018-06-07 NOTE — Progress Notes (Signed)
Subjective:    Patient ID: Alyssa Norman, female    DOB: [birth year], 42 y.o.   MRN: [id number]  HPI Chief Complaint  Patient presents with  . new    new pt diabetes   She is new to the practice and here to establish care and also to follow up on diabetes. No medical records available.  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   States she was diagnosed with diabetes after her 1st pregnancy 25 years ago. States she has been wanting to see a diabetes specialist but was never referred.  States she took oral medications for years but has always been on insulin.  Reports having low blood sugars in the 30s. States 2 nights ago her blood sugar was in the 60s.  She adjusts her novolog with a sliding scale. Takes 10 units of Lantus nightly. States her new insurance will not cover Lantus and she needs to figure out what to do next.   Checks blood sugars 3-4 times per day.   Has been walking a lot on her job. No other exercise.  Diet is nondiscretionary.   Denies fever, chills, dizziness, blurred or double vision, chest pain, palpitations, shortness of breath, abdominal pain, N/V/D, urinary symptoms, LE edema. No polyuria, polydipsia.    Diabetic eye exam in July per patient.   Other providers:  None   Smoking 1/2 pack per day since age 29. She has tried medications in the past.  Alcohol use is 3-4 times per week. 1 beer usually.  Denies drug use.   Reports history of vitamin D deficiency and has been taking vitamin D prescription for the past 2 months.    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  Single. Lives alone. Has 2 children ages 14 and 71.   LMP: today and regular Sexually active and no contraception   Reviewed allergies, medications, past medical, surgical, family, and social history.     Review of Systems Pertinent positives and negatives in the history of present illness.     Objective:   Physical Exam BP 120/70   Pulse 88    Ht 5\' 4"  (1.626 m)   Wt 125 lb (56.7 kg)   LMP 06/07/2018 (Exact Date)   BMI 21.46 kg/m   Alert and in no distress. Pharyngeal area is normal. Neck is supple without adenopathy or thyromegaly. Cardiac exam shows a regular sinus rhythm without murmurs or gallops. Lungs are clear to auscultation. Extremities without edema. Skin is warm and dry, no pallor or rash.       Assessment & Plan:  Type 2 diabetes mellitus without complication, with long-term current use of insulin (HCC) - Plan: HgB A1c, CBC with Differential/Platelet, Comprehensive metabolic panel, POCT Urinalysis DIP (Proadvantage Device), TSH, T4, free, T3, Lipid panel, Ambulatory referral to Endocrinology, Microalbumin / creatinine urine ratio  Essential hypertension - Plan: CBC with Differential/Platelet, Comprehensive metabolic panel  Vitamin D deficiency - Plan: VITAMIN D 25 Hydroxy (Vit-D Deficiency, Fractures)  Hypoglycemia associated with diabetes (HCC) - Plan: Ambulatory referral to Endocrinology  Fatigue, unspecified type - Plan: CBC with Differential/Platelet, Comprehensive metabolic panel, TSH, T4, free, T3, VITAMIN D 25 Hydroxy (Vit-D Deficiency, Fractures), Vitamin B12  Hgb A1c 7.9% today.  She is a pleasant 42 year old female here to establish care and with complicated diabetes.  She is requesting to be referred to an endocrinologist and I think this is a good idea based on recent hypoglycemia and insulin-dependent diabetes. Recommend  she start writing down her blood sugar readings and take the log with her to her appointment with the endocrinologist.  Advised her to cut back Lantus dose from 10 units to 5 units daily and to be very careful with mealtime insulin, making sure she is eating adequate protein and calories. She does carry hard candy and snacks with her in case of low blood sugars. Blood pressure is within goal range today.  Continue on lisinopril for now. She does complain of fatigue and I will check labs  to look for underlying etiology. She is not on a statin.  Lipids checked today. Reports diabetic eye exam is up-to-date although I have no record of this. Urine microalbumin ordered.  She does have glucose in her urine today. History of vitamin D deficiency and completed 2 months of prescription vitamin D supplement.  Check vitamin D level today. Follow-up pending labs.

## 2018-06-08 LAB — MICROALBUMIN / CREATININE URINE RATIO
Creatinine, Urine: 127.8 mg/dL
Microalb/Creat Ratio: 13.3 mg/g creat (ref 0.0–30.0)
Microalbumin, Urine: 17 ug/mL

## 2018-06-09 LAB — CBC WITH DIFFERENTIAL/PLATELET
BASOS: 1 %
Basophils Absolute: 0 10*3/uL (ref 0.0–0.2)
EOS (ABSOLUTE): 0.1 10*3/uL (ref 0.0–0.4)
EOS: 1 %
HEMATOCRIT: 42.5 % (ref 34.0–46.6)
Hemoglobin: 14.3 g/dL (ref 11.1–15.9)
Immature Grans (Abs): 0 10*3/uL (ref 0.0–0.1)
Immature Granulocytes: 0 %
LYMPHS ABS: 2.2 10*3/uL (ref 0.7–3.1)
Lymphs: 38 %
MCH: 32.5 pg (ref 26.6–33.0)
MCHC: 33.6 g/dL (ref 31.5–35.7)
MCV: 97 fL (ref 79–97)
MONOS ABS: 0.5 10*3/uL (ref 0.1–0.9)
Monocytes: 8 %
NEUTROS ABS: 3 10*3/uL (ref 1.4–7.0)
Neutrophils: 52 %
PLATELETS: 279 10*3/uL (ref 150–450)
RBC: 4.4 x10E6/uL (ref 3.77–5.28)
RDW: 12.5 % (ref 12.3–15.4)
WBC: 5.8 10*3/uL (ref 3.4–10.8)

## 2018-06-09 LAB — COMPREHENSIVE METABOLIC PANEL
A/G RATIO: 1.9 (ref 1.2–2.2)
ALT: 13 IU/L (ref 0–32)
AST: 23 IU/L (ref 0–40)
Albumin: 4.9 g/dL (ref 3.5–5.5)
Alkaline Phosphatase: 71 IU/L (ref 39–117)
BILIRUBIN TOTAL: 0.2 mg/dL (ref 0.0–1.2)
BUN/Creatinine Ratio: 10 (ref 9–23)
BUN: 10 mg/dL (ref 6–24)
CHLORIDE: 104 mmol/L (ref 96–106)
CO2: 19 mmol/L — ABNORMAL LOW (ref 20–29)
Calcium: 9.7 mg/dL (ref 8.7–10.2)
Creatinine, Ser: 1.02 mg/dL — ABNORMAL HIGH (ref 0.57–1.00)
GFR calc Af Amer: 78 mL/min/{1.73_m2} (ref >59)
GFR calc non Af Amer: 68 mL/min/{1.73_m2} (ref >59)
GLOBULIN, TOTAL: 2.6 g/dL (ref 1.5–4.5)
Glucose: 56 mg/dL — ABNORMAL LOW (ref 65–99)
POTASSIUM: 5 mmol/L (ref 3.5–5.2)
SODIUM: 142 mmol/L (ref 134–144)
Total Protein: 7.5 g/dL (ref 6.0–8.5)

## 2018-06-09 LAB — LIPID PANEL
CHOL/HDL RATIO: 1.7 ratio (ref 0.0–4.4)
Cholesterol, Total: 267 mg/dL — ABNORMAL HIGH (ref 100–199)
HDL: 159 mg/dL (ref >39)
LDL CALC: 89 mg/dL (ref 0–99)
TRIGLYCERIDES: 94 mg/dL (ref 0–149)
VLDL CHOLESTEROL CAL: 19 mg/dL (ref 5–40)

## 2018-06-09 LAB — VITAMIN B12: Vitamin B-12: 1029 pg/mL (ref 232–1245)

## 2018-06-09 LAB — T4, FREE: Free T4: 0.87 ng/dL (ref 0.82–1.77)

## 2018-06-09 LAB — TSH: TSH: 0.983 u[IU]/mL (ref 0.450–4.500)

## 2018-06-09 LAB — VITAMIN D 25 HYDROXY (VIT D DEFICIENCY, FRACTURES): Vit D, 25-Hydroxy: 25.4 ng/mL — ABNORMAL LOW (ref 30.0–100.0)

## 2018-06-09 LAB — T3: T3 TOTAL: 121 ng/dL (ref 71–180)

## 2018-06-10 ENCOUNTER — Other Ambulatory Visit: Payer: Self-pay

## 2018-06-10 ENCOUNTER — Other Ambulatory Visit: Payer: Self-pay | Admitting: Obstetrics and Gynecology

## 2018-06-10 DIAGNOSIS — N644 Mastodynia: Secondary | ICD-10-CM

## 2018-06-10 DIAGNOSIS — N6320 Unspecified lump in the left breast, unspecified quadrant: Secondary | ICD-10-CM | POA: Diagnosis not present

## 2018-06-10 DIAGNOSIS — B373 Candidiasis of vulva and vagina: Secondary | ICD-10-CM | POA: Diagnosis not present

## 2018-06-10 DIAGNOSIS — N898 Other specified noninflammatory disorders of vagina: Secondary | ICD-10-CM | POA: Diagnosis not present

## 2018-06-10 MED ORDER — BASAGLAR KWIKPEN 100 UNIT/ML ~~LOC~~ SOPN: 10.0000 [IU] | PEN_INJECTOR | Freq: Every day | SUBCUTANEOUS | 2 refills | Status: DC

## 2018-06-15 ENCOUNTER — Ambulatory Visit
Admission: RE | Admit: 2018-06-15 | Discharge: 2018-06-15 | Disposition: A | Discharge status: Home / Self Care | Payer: PRIVATE HEALTH INSURANCE | Source: Ambulatory Visit | Attending: Obstetrics and Gynecology | Admitting: Obstetrics and Gynecology

## 2018-06-15 DIAGNOSIS — N644 Mastodynia: Secondary | ICD-10-CM | POA: Diagnosis not present

## 2018-06-15 DIAGNOSIS — N6320 Unspecified lump in the left breast, unspecified quadrant: Secondary | ICD-10-CM

## 2018-06-15 DIAGNOSIS — R922 Inconclusive mammogram: Secondary | ICD-10-CM | POA: Diagnosis not present

## 2018-07-05 ENCOUNTER — Other Ambulatory Visit: Payer: Self-pay | Admitting: Obstetrics and Gynecology

## 2018-07-05 ENCOUNTER — Encounter: Payer: Self-pay | Admitting: Endocrinology

## 2018-07-05 ENCOUNTER — Ambulatory Visit: Payer: 59 | Admitting: Endocrinology

## 2018-07-05 VITALS — BP 130/76 | HR 92 | Ht 64.0 in | Wt 120.4 lb

## 2018-07-05 DIAGNOSIS — E109 Type 1 diabetes mellitus without complications: Secondary | ICD-10-CM

## 2018-07-05 MED ORDER — FREESTYLE LIBRE 14 DAY SENSOR MISC: 1.0000 | 3 refills | Status: AC

## 2018-07-05 MED ORDER — FREESTYLE LIBRE 14 DAY READER DEVI: 1.0000 | Freq: Once | 0 refills | Status: AC

## 2018-07-05 MED ORDER — INSULIN ASPART 100 UNIT/ML FLEXPEN: 7.0000 [IU] | PEN_INJECTOR | Freq: Three times a day (TID) | SUBCUTANEOUS | 11 refills | Status: DC

## 2018-07-05 MED ORDER — BASAGLAR KWIKPEN 100 UNIT/ML ~~LOC~~ SOPN: 8.0000 [IU] | PEN_INJECTOR | Freq: Every day | SUBCUTANEOUS | 2 refills | Status: DC

## 2018-07-05 NOTE — Patient Instructions (Addendum)
good diet and exercise significantly improve the control of your diabetes.  please let me know if you wish to be referred to a dietician.  high blood sugar is very risky to your health.  you should see an eye doctor and dentist every year.  It is very important to get all recommended vaccinations.  Controlling your blood pressure and cholesterol drastically reduces the damage diabetes does to your body.  Those who smoke should quit.  Please discuss these with your doctor.  check your blood sugar 4 times a day: before the 3 meals, and at bedtime.  also check if you have symptoms of your blood sugar being too high or too low.  please keep a record of the readings and bring it to your next appointment here (or you can bring the meter itself).  You can write it on any piece of paper.  please call us sooner if your blood sugar goes below 70, or if you have a lot of readings over 200.  I have sent a prescription to your pharmacy, for the freestyle libre continuous glucose monitor.  Call if you want help with this.   Please come back for a follow-up appointment in 2-3 months.

## 2018-07-05 NOTE — Progress Notes (Signed)
Subjective:    Patient ID: Alyssa Norman, female    DOB: [birth year], 42 y.o.   MRN: [id number]  HPI pt is referred by Hetty Blend, NP, for diabetes.  Pt states DM was dx'ed in 1994; she has mild if any neuropathy of the lower extremities; she is unaware of any associated chronic complications; she has been on insulin since dx; pt says her diet and exercise are good; she has never had pancreatitis or pancreatic surgery.  Last episode of severe hypoglycemia was in 2017.  She has had DKA only once--at the time of dx.  She says she is not at risk for another pregnancy.   She takes basaglar 10 units qd, and novolog approx 6 units 3 times a day (just before each meal--depending on cbg and size of the meal).  She has mild hypoglycemia approx qod--this happens in the middle of the night.  She says cbg is highest during the day.  She works 2nd shift--[place].   Past Medical History:  Diagnosis Date  . Diabetes mellitus (HCC)   . Hypertension     Past Surgical History:  Procedure Laterality Date  . CESAREAN SECTION    . FINGER GANGLION CYST EXCISION      Social History   Socioeconomic History  . Marital status: Single    Spouse name: Not on file  . Number of children: Not on file  . Years of education: Not on file  . Highest education level: Not on file  Occupational History  . Not on file  Social Needs  . Financial resource strain: Not on file  . Food insecurity:    Worry: Not on file    Inability: Not on file  . Transportation needs:    Medical: Not on file    Non-medical: Not on file  Tobacco Use  . Smoking status: Current Every Day Smoker    Packs/day: 0.50    Years: 29.00    Pack years: 14.50  . Smokeless tobacco: Never Used  Substance and Sexual Activity  . Alcohol use: Yes    Comment: 3-4 times per week  . Drug use: Never  . Sexual activity: Yes  Lifestyle  . Physical activity:    Days per week: Not on file    Minutes per session: Not on file  . Stress: Not on  file  Relationships  . Social connections:    Talks on phone: Not on file    Gets together: Not on file    Attends religious service: Not on file    Active member of club or organization: Not on file    Attends meetings of clubs or organizations: Not on file    Relationship status: Not on file  . Intimate partner violence:    Fear of current or ex partner: Not on file    Emotionally abused: Not on file    Physically abused: Not on file    Forced sexual activity: Not on file  Other Topics Concern  . Not on file  Social History Narrative  . Not on file    Current Outpatient Medications on File Prior to Visit  Medication Sig Dispense Refill  . ergocalciferol (VITAMIN D2) 50000 units capsule Take 50,000 Units by mouth once a week.    Marland Kitchen lisinopril (PRINIVIL,ZESTRIL) 10 MG tablet Take 10 mg by mouth daily.     No current facility-administered medications on file prior to visit.     No Known Allergies  Family History  Problem  Relation Age of Onset  . Diabetes Mother     BP 130/76   Pulse 92   Ht 5\' 4"  (1.626 m)   Wt 120 lb 6.4 oz (54.6 kg)   LMP 06/07/2018 (Exact Date)   SpO2 97%   BMI 20.67 kg/m     Review of Systems denies weight loss, blurry vision, headache, chest pain, sob, n/v, urinary frequency, muscle cramps, excessive diaphoresis, memory loss, depression, and rhinorrhea.  She has fatigue, cold intolerance, and easy bruising.        Objective:   Physical Exam VS: see vs page GEN: no distress HEAD: head: no deformity eyes: no periorbital swelling, no proptosis external nose and ears are normal mouth: no lesion seen NECK: supple, thyroid is not enlarged CHEST WALL: no deformity LUNGS: clear to auscultation CV: reg rate and rhythm, no murmur.  ABD: abdomen is soft, nontender.  no hepatosplenomegaly.  not distended.  no hernia MUSCULOSKELETAL: muscle bulk and strength are grossly normal.  no obvious joint swelling.  gait is normal and steady EXTEMITIES: no  deformity.  no ulcer on the feet.  feet are of normal color and temp.  no edema.  There is bilateral onychomycosis of the toenails.   PULSES: dorsalis pedis intact bilat.  no carotid bruit NEURO:  cn 2-12 grossly intact.   readily moves all 4's.  sensation is intact to touch on the feet SKIN:  Normal texture and temperature.  No rash or suspicious lesion is visible.   NODES:  None palpable at the neck.   PSYCH: alert, well-oriented.  Does not appear anxious nor depressed.     Lab Results  Component Value Date   HGBA1C 7.9 (A) 06/07/2018   Lab Results  Component Value Date   CREATININE 1.02 (H) 06/07/2018   BUN 10 06/07/2018   NA 142 06/07/2018   K 5.0 06/07/2018   CL 104 06/07/2018   CO2 19 (L) 06/07/2018   Lab Results  Component Value Date   TSH 0.983 06/07/2018   T3TOTAL 121 06/07/2018   I have reviewed outside records, and summarized: Pt was noted to have elevated a1c, and referred here.  She was noted to have hypoglycemia.  Ins was not paying for insulin.        Assessment & Plan:  Type 1 DM, new to me: we discussed.  she declines pump.    Patient Instructions  good diet and exercise significantly improve the control of your diabetes.  please let me know if you wish to be referred to a dietician.  high blood sugar is very risky to your health.  you should see an eye doctor and dentist every year.  It is very important to get all recommended vaccinations.  Controlling your blood pressure and cholesterol drastically reduces the damage diabetes does to your body.  Those who smoke should quit.  Please discuss these with your doctor.  check your blood sugar 4 times a day: before the 3 meals, and at bedtime.  also check if you have symptoms of your blood sugar being too high or too low.  please keep a record of the readings and bring it to your next appointment here (or you can bring the meter itself).  You can write it on any piece of paper.  please call us sooner if your blood  sugar goes below 70, or if you have a lot of readings over 200.  I have sent a prescription to your pharmacy, for the freestyle [place] continuous  glucose monitor.  Call if you want help with this.   Please come back for a follow-up appointment in 2-3 months.

## 2018-07-06 DIAGNOSIS — D3131 Benign neoplasm of right choroid: Secondary | ICD-10-CM | POA: Diagnosis not present

## 2018-07-28 DIAGNOSIS — Z23 Encounter for immunization: Secondary | ICD-10-CM | POA: Diagnosis not present

## 2018-08-19 DIAGNOSIS — B9689 Other specified bacterial agents as the cause of diseases classified elsewhere: Secondary | ICD-10-CM | POA: Diagnosis not present

## 2018-08-19 DIAGNOSIS — N760 Acute vaginitis: Secondary | ICD-10-CM | POA: Diagnosis not present

## 2018-08-25 ENCOUNTER — Other Ambulatory Visit: Payer: Self-pay

## 2018-08-25 MED ORDER — LISINOPRIL 10 MG PO TABS: 10.0000 mg | ORAL_TABLET | Freq: Every day | ORAL | 2 refills | Status: DC

## 2018-08-25 NOTE — Telephone Encounter (Signed)
Patient called to request a refill on the pending medication  

## 2018-08-29 ENCOUNTER — Ambulatory Visit: Payer: BLUE CROSS/BLUE SHIELD | Admitting: Podiatry

## 2018-08-29 ENCOUNTER — Encounter: Payer: Self-pay | Admitting: Podiatry

## 2018-08-29 VITALS — BP 135/98 | HR 88

## 2018-08-29 DIAGNOSIS — L989 Disorder of the skin and subcutaneous tissue, unspecified: Secondary | ICD-10-CM

## 2018-08-29 DIAGNOSIS — T304 Corrosion of unspecified body region, unspecified degree: Secondary | ICD-10-CM

## 2018-08-29 NOTE — Progress Notes (Signed)
This patient presents the office stating that she has had painful skin lesions on both feet.  She says she has treated the painful skin lesions by applying acid to the skin lesion sites.  She presents the office today stating she is now experiencing severe pain and discomfort at the skin sites.  She says that these skin sites with the acid applied are painful walking and wearing her shoes.  She presents the office today for an evaluation and treatment of these painful skin lesions.  Vascular  Dorsalis pedis and posterior tibial pulses are palpable  B/L.  Capillary return  WNL.  Temperature gradient is  WNL.  Skin turgor  WNL  Sensorium  Senn Weinstein monofilament wire  WNL. Normal tactile sensation.  Nail Exam  Patient has normal nails with no evidence of bacterial or fungal infection.  Orthopedic  Exam  Muscle tone and muscle strength  WNL.  No limitations of motion feet  B/L.  No crepitus or joint effusion noted.  Foot type is unremarkable and digits show no abnormalities.  Bony prominences are unremarkable.  Skin  No open lesions.  Normal skin texture and turgor.  White necrotic tissue noted in the left heel and white necrotic tissue noted sub-tibial sesamoid right foot.  Benign skin lesion  B/L  Chemical burn both feet.    IE.  Examination of her feet reveal 2 well-defined areas of salicylic acid application.  Palpable pain at the site of the acid application.  Debridement of the necrotic tissue noted at the benign skin lesion areas.  Patient was told to discontinue any future acid use and to return to the office in 1 week for us to determine what benign skin lesion is present.   Helane GuntherGregory Kynslei Art DPM

## 2018-09-02 DIAGNOSIS — D3131 Benign neoplasm of right choroid: Secondary | ICD-10-CM | POA: Diagnosis not present

## 2018-09-02 DIAGNOSIS — H5213 Myopia, bilateral: Secondary | ICD-10-CM | POA: Diagnosis not present

## 2018-09-02 DIAGNOSIS — H04123 Dry eye syndrome of bilateral lacrimal glands: Secondary | ICD-10-CM | POA: Diagnosis not present

## 2018-09-05 ENCOUNTER — Ambulatory Visit: Payer: 59 | Admitting: Endocrinology

## 2018-09-05 ENCOUNTER — Ambulatory Visit: Payer: BLUE CROSS/BLUE SHIELD | Admitting: Podiatry

## 2018-09-05 ENCOUNTER — Encounter: Payer: Self-pay | Admitting: Podiatry

## 2018-09-05 DIAGNOSIS — B070 Plantar wart: Secondary | ICD-10-CM

## 2018-09-05 DIAGNOSIS — L989 Disorder of the skin and subcutaneous tissue, unspecified: Secondary | ICD-10-CM

## 2018-09-05 DIAGNOSIS — Z0289 Encounter for other administrative examinations: Secondary | ICD-10-CM

## 2018-09-05 NOTE — Progress Notes (Signed)
This patient returns to the office for follow-up for painful skin lesions on the bottom of both feet.  She has discontinued acid usage and presents the office today stating that she is having pain and discomfort at the sites of the 2 skin lesions.  One skin lesion is located on the plantar aspect of the left heel and the other skin lesion is noted under the big toe joint right foot.  She returns to the office today for further evaluation and treatment of these painful skin lesions.  Vascular  Dorsalis pedis and posterior tibial pulses are palpable  B/L.  Capillary return  WNL.  Temperature gradient is  WNL.  Skin turgor  WNL  Sensorium  Senn Weinstein monofilament wire  WNL. Normal tactile sensation.  Nail Exam  Patient has normal nails with no evidence of bacterial or fungal infection.  Orthopedic  Exam  Muscle tone and muscle strength  WNL.  No limitations of motion feet  B/L.  No crepitus or joint effusion noted.  Foot type is unremarkable and digits show no abnormalities.  Bony prominences are unremarkable.  Skin  No open lesions.  Normal skin texture and turgor.   2 well-defined well-circumscribed plantar lesions noted on both feet.  White cauliflower appearing tissue is noted with thrombi noted in the center of these 2 lesions.    Benign skin lesion  Warts  B/l  ROV.  Discussed this condition with this patient and we opted to perform surgical excision of the warts from both feet.  This patient was anesthetized with mixture of 2% lidocaine plain and 2 % lidocaine with epi. at the site of the skin lesions.  The surgical sites were  then washed with betadine and alcohol.  Using a punch the lesion was excised and passed off as specimen.  The surgical site was cauterized with phenol and washed with alcohol.  The site was bandaged with neosporin, sterile 2x2 and kling.  Home instructions given.RTC 1 week.  Discussed post op bleeding with this patient.   Helane GuntherGregory Lynell Kussman DPM

## 2018-09-06 NOTE — Addendum Note (Signed)
Addended by: Geraldine ContrasVENABLE, ANGELA D on: 09/06/2018 08:55 AM   Modules accepted: Orders

## 2018-09-12 ENCOUNTER — Ambulatory Visit (INDEPENDENT_AMBULATORY_CARE_PROVIDER_SITE_OTHER): Payer: BLUE CROSS/BLUE SHIELD | Admitting: Podiatry

## 2018-09-12 ENCOUNTER — Encounter: Payer: Self-pay | Admitting: Podiatry

## 2018-09-12 DIAGNOSIS — B070 Plantar wart: Secondary | ICD-10-CM

## 2018-09-12 DIAGNOSIS — L989 Disorder of the skin and subcutaneous tissue, unspecified: Secondary | ICD-10-CM

## 2018-09-12 NOTE — Progress Notes (Signed)
This patient returns to the office for follow-up for skin lesions on the bottom of both feet.  These lesions were removed 0n 11-25 and she has been soaking and bandaging her feet.  She says there is minimal pain compared to prior to surgery.  She presents for continued evaluation and treatment.  Vascular  Dorsalis pedis and posterior tibial pulses are palpable  B/L.  Capillary return  WNL.  Temperature gradient is  WNL.  Skin turgor  WNL  Sensorium  Senn Weinstein monofilament wire  WNL. Normal tactile sensation.  Nail Exam  Patient has normal nails with no evidence of bacterial or fungal infection.  Orthopedic  Exam  Muscle tone and muscle strength  WNL.  No limitations of motion feet  B/L.  No crepitus or joint effusion noted.  Foot type is unremarkable and digits show no abnormalities.  Bony prominences are unremarkable.  Skin  No open lesions.  Normal skin texture and turgor.   2 well-defined  Well circumscribed skin lesions with healthy tissue at the site of the skin surgery.   Benign skin lesion  Warts  B/l  ROV.   Debride surgical sites with # 15.  Neosporin/DSD.  RTC prn.   Helane GuntherGregory Vanshika Jastrzebski DPM

## 2018-10-13 ENCOUNTER — Encounter: Payer: Self-pay | Admitting: Podiatry

## 2018-10-13 ENCOUNTER — Ambulatory Visit: Payer: BLUE CROSS/BLUE SHIELD | Admitting: Podiatry

## 2018-10-13 DIAGNOSIS — Q141 Congenital malformation of retina: Secondary | ICD-10-CM | POA: Diagnosis not present

## 2018-10-13 DIAGNOSIS — Z09 Encounter for follow-up examination after completed treatment for conditions other than malignant neoplasm: Secondary | ICD-10-CM | POA: Diagnosis not present

## 2018-10-13 NOTE — Progress Notes (Signed)
This patient presents the office stating that she has two painful skin lesions both feet.  She has warts removed by myself in November.  She says the pain she is now experiencing is different from prior to wart removal.  .  She presents the office today for an evaluation and treatment of these painful skin lesions.  Vascular  Dorsalis pedis and posterior tibial pulses are palpable  B/L.  Capillary return  WNL.  Temperature gradient is  WNL.  Skin turgor  WNL  Sensorium  Senn Weinstein monofilament wire  WNL. Normal tactile sensation.  Nail Exam  Patient has normal nails with no evidence of bacterial or fungal infection.  Orthopedic  Exam  Muscle tone and muscle strength  WNL.  No limitations of motion feet  B/L.  No crepitus or joint effusion noted.  Foot type is unremarkable and digits show no abnormalities.  Bony prominences are unremarkable.  Skin  No open lesions.  Normal skin texture and turgor.  Necrotic tissue noted sub 1 right foot and sub left heel.  No redness or swelling or drainage noted.  Benign skin lesion  B/L      Debridement of the necrotic tissue noted at the benign skin lesion areas. No redness or drainage noted. Upon leaving she stated she was feeling no pain.  RTC prn.   Helane Gunther DPM

## 2018-10-27 ENCOUNTER — Other Ambulatory Visit: Payer: Self-pay | Admitting: Obstetrics and Gynecology

## 2018-10-31 ENCOUNTER — Other Ambulatory Visit: Payer: Self-pay | Admitting: Obstetrics and Gynecology

## 2018-10-31 ENCOUNTER — Ambulatory Visit: Payer: BLUE CROSS/BLUE SHIELD | Admitting: Internal Medicine

## 2018-10-31 ENCOUNTER — Encounter: Payer: Self-pay | Admitting: Internal Medicine

## 2018-10-31 VITALS — BP 136/84 | HR 91 | Temp 98.0°F | Ht 65.0 in | Wt 149.0 lb

## 2018-10-31 DIAGNOSIS — I10 Essential (primary) hypertension: Secondary | ICD-10-CM

## 2018-10-31 DIAGNOSIS — R928 Other abnormal and inconclusive findings on diagnostic imaging of breast: Secondary | ICD-10-CM

## 2018-10-31 NOTE — Progress Notes (Signed)
  Subjective:     Patient ID: Cheryl Hill , female    DOB: Jul 03, 1976 , 43 y.o.   MRN: [id number]   Chief Complaint  Patient presents with  . Hypertension    HPI  Hypertension  This is a chronic problem. The current episode started more than 1 year ago. The problem has been gradually improving since onset. The problem is controlled. Pertinent negatives include no blurred vision, chest pain, palpitations or shortness of breath.   She reports compliance with meds. She is upset today b/c she realizes another JLewis' information is in her chart. She has spoken to her podiatrist about this already. She is aware that our office just started using Epic Oct 2019, and this is her first visit here since we have started Epic.   Past Medical History:  Diagnosis Date  . Atopic dermatitis 11/10/2016  . Diabetes mellitus without complication ([place])   . Hypertension      Family History  Problem Relation Age of Onset  . Diabetes Mother   . Hypertension Mother   . Hyperlipidemia Mother   . Diabetes Father   . Hyperlipidemia Father   . Hypertension Father   . Prostate cancer Father      Current Outpatient Medications:  .  etonogestrel (NEXPLANON) 68 MG IMPL implant, Inject into the skin., Disp: , Rfl:  .  losartan (COZAAR) 50 MG tablet, Take 50 mg by mouth daily., Disp: , Rfl: 2   No Known Allergies   Review of Systems  Constitutional: Negative.   Eyes: Negative for blurred vision.  Respiratory: Negative.  Negative for shortness of breath.   Cardiovascular: Negative.  Negative for chest pain and palpitations.  Gastrointestinal: Negative.   Neurological: Negative.   Psychiatric/Behavioral: Negative.      Today's Vitals   10/31/18 1409  BP: 136/84  Pulse: 91  Temp: 98 F (36.7 C)  TempSrc: Oral  Weight: 149 lb (67.6 kg)  Height: '5\' 5"'$  (1.651 m)   Body mass index is 24.79 kg/m.   Objective:  Physical Exam Constitutional:      Appearance: Normal appearance.  HENT:   Head: Normocephalic and atraumatic.  Cardiovascular:     Rate and Rhythm: Normal rate and regular rhythm.     Heart sounds: Normal heart sounds.  Pulmonary:     Effort: Pulmonary effort is normal.     Breath sounds: Normal breath sounds.  Skin:    General: Skin is warm.  Neurological:     Mental Status: She is alert.  Psychiatric:        Mood and Affect: Mood normal.         Assessment And Plan:     1. Benign essential hypertension  Fair control. She will continue with current meds for now. She is encouraged to exercise 30 minutes five days weekly. She will rto in six months for her next physical examination.   Of note: she was advised to speak to office manager regarding the misinformation in Epic. Her medical/family hx/allergies etc were corrected/updated during her visit.   - BMP8+EGFR        Maximino Greenland, MD

## 2018-10-31 NOTE — Patient Instructions (Signed)

## 2018-11-01 LAB — BMP8+EGFR
BUN/Creatinine Ratio: 15 (ref 9–23)
BUN: 13 mg/dL (ref 6–24)
CO2: 22 mmol/L (ref 20–29)
Calcium: 9.8 mg/dL (ref 8.7–10.2)
Chloride: 102 mmol/L (ref 96–106)
Creatinine, Ser: 0.86 mg/dL (ref 0.57–1.00)
GFR calc Af Amer: 96 mL/min/{1.73_m2} (ref >59)
GFR calc non Af Amer: 84 mL/min/{1.73_m2} (ref >59)
Glucose: 71 mg/dL (ref 65–99)
Potassium: 4.4 mmol/L (ref 3.5–5.2)
Sodium: 138 mmol/L (ref 134–144)

## 2018-11-04 ENCOUNTER — Telehealth: Payer: Self-pay

## 2018-11-04 NOTE — Telephone Encounter (Signed)
Left a message

## 2018-11-04 NOTE — Telephone Encounter (Signed)
-----   Message from Dorothyann Peng, MD sent at 11/01/2018  9:00 AM EST ----- Your kidney fxn is nl

## 2018-12-03 ENCOUNTER — Emergency Department
Admission: EM | Admit: 2018-12-03 | Discharge: 2018-12-03 | Disposition: A | Discharge status: Home / Self Care | Payer: BLUE CROSS/BLUE SHIELD | Attending: Emergency Medicine | Admitting: Emergency Medicine

## 2018-12-03 ENCOUNTER — Other Ambulatory Visit: Payer: Self-pay

## 2018-12-03 DIAGNOSIS — S60455A Superficial foreign body of left ring finger, initial encounter: Secondary | ICD-10-CM

## 2018-12-03 DIAGNOSIS — L089 Local infection of the skin and subcutaneous tissue, unspecified: Secondary | ICD-10-CM | POA: Diagnosis not present

## 2018-12-03 DIAGNOSIS — L03012 Cellulitis of left finger: Secondary | ICD-10-CM | POA: Diagnosis not present

## 2018-12-03 DIAGNOSIS — I10 Essential (primary) hypertension: Secondary | ICD-10-CM | POA: Insufficient documentation

## 2018-12-03 DIAGNOSIS — Y9289 Other specified places as the place of occurrence of the external cause: Secondary | ICD-10-CM | POA: Insufficient documentation

## 2018-12-03 DIAGNOSIS — W4904XA Ring or other jewelry causing external constriction, initial encounter: Secondary | ICD-10-CM | POA: Diagnosis not present

## 2018-12-03 DIAGNOSIS — S60445A External constriction of left ring finger, initial encounter: Secondary | ICD-10-CM | POA: Diagnosis not present

## 2018-12-03 DIAGNOSIS — Y998 Other external cause status: Secondary | ICD-10-CM | POA: Diagnosis not present

## 2018-12-03 DIAGNOSIS — Y9389 Activity, other specified: Secondary | ICD-10-CM | POA: Diagnosis not present

## 2018-12-03 DIAGNOSIS — Z79899 Other long term (current) drug therapy: Secondary | ICD-10-CM | POA: Diagnosis not present

## 2018-12-03 DIAGNOSIS — R2232 Localized swelling, mass and lump, left upper limb: Secondary | ICD-10-CM | POA: Diagnosis not present

## 2018-12-03 DIAGNOSIS — E119 Type 2 diabetes mellitus without complications: Secondary | ICD-10-CM | POA: Diagnosis not present

## 2018-12-03 MED ORDER — CLINDAMYCIN HCL 300 MG PO CAPS: 300.0000 mg | ORAL_CAPSULE | Freq: Four times a day (QID) | ORAL | 0 refills | Status: DC

## 2018-12-03 MED ORDER — CLINDAMYCIN HCL 150 MG PO CAPS
300.0000 mg | ORAL_CAPSULE | Freq: Once | ORAL | Status: AC
  Administered 2018-12-03: 300 mg via ORAL
  Filled 2018-12-03: qty 2

## 2018-12-03 MED ORDER — MUPIROCIN CALCIUM 2 % EX CREA: TOPICAL_CREAM | CUTANEOUS | 0 refills | Status: DC

## 2018-12-03 NOTE — ED Notes (Signed)
Pt to flex with zach and ring cutter, per sylvia, rn ring will be cut.

## 2018-12-03 NOTE — ED Triage Notes (Signed)
Pt with left fourth finger swelling and redness for 4 hours. No obvious injury per pt. Pt denies being bitten. Pt has wedding band in place that will need to be cut. Pt states bands "have always irritated my skin, it's getting worse".

## 2018-12-03 NOTE — ED Provider Notes (Signed)
[name] Emergency Department Provider Note  ____________________________________________  Time seen: Approximately 10:08 PM  I have reviewed the triage vital signs and the nursing notes.   HISTORY  Chief Complaint finger swelling    HPI Cheryl Hill is a 43 y.o. female who presents the emergency department complaining of edema of the proximal phalanx of the ring finger on the left hand as well as entrapped rings to the finger.  Patient presents the emergency department complaining of swelling to her fourth digit of her left hand as well as being unable to remove the rings off the digit.  Patient reports that she has had intermittent problems with reaction to the metal and her wedding rings.  Patient reports that she is allergic to nickel and she believes that the gold has been mixed with some amount of nickel.  Patient often has irritation underlying her rings and does not always wear her wedding rings on the left hand.  Patient had significant edema to the digit starting today to the point that she was unable to remove the rings.  She feels as if the rings are cutting off the circulation to her finger at this time.  No other complaint at this time.  No medications prior to arrival.  Patient does have a history of atopic dermatitis, diabetes, hypertension.    Past Medical History:  Diagnosis Date  . Atopic dermatitis 11/10/2016  . Diabetes mellitus without complication (H[facility])   . Hypertension     Patient Active Problem List   Diagnosis Date Noted  . STD exposure 12/08/2016  . Essential hypertension, benign 08/10/2014  . Neuropathy of leg 08/10/2014    No past surgical history on file.  Prior to Admission medications   Medication Sig Start Date End Date Taking? Authorizing Provider  clindamycin (CLEOCIN) 300 MG capsule Take 1 capsule (300 mg total) by mouth 4 (four) times daily. 12/03/18   Betzaida Cremeens, Delorise RoyalsJonathan D, PA-C  etonogestrel (NEXPLANON) 68  MG IMPL implant Inject into the skin.    [provider]  losartan (COZAAR) 50 MG tablet Take 50 mg by mouth daily. 07/11/18   [provider]  mupirocin cream (BACTROBAN) 2 % Apply to affected area 3 times daily 12/03/18   Lakeem Rozo, Delorise RoyalsJonathan D, PA-C    Allergies Patient has no known allergies.  Family History  Problem Relation Age of Onset  . Diabetes Mother   . Hypertension Mother   . Hyperlipidemia Mother   . Diabetes Father   . Hyperlipidemia Father   . Hypertension Father   . Prostate cancer Father     Social History Social History   Tobacco Use  . Smoking status: Never Smoker  . Smokeless tobacco: Never Used  Substance Use Topics  . Alcohol use: Never    Frequency: Never  . Drug use: No     Review of Systems  Constitutional: No fever/chills Eyes: No visual changes.  Cardiovascular: no chest pain. Respiratory: no cough. No SOB. Gastrointestinal: No abdominal pain.  No nausea, no vomiting.  Musculoskeletal: Positive for pain, erythema, edema with entrapped rings to the fourth digit of the left hand. Skin: Negative for rash, abrasions, lacerations, ecchymosis. Neurological: Negative for headaches, focal weakness or numbness. 10-point ROS otherwise negative.  ____________________________________________   PHYSICAL EXAM:  VITAL SIGNS: ED Triage Vitals  Enc Vitals Group     BP 12/03/18 2127 (!) 179/98     Pulse Rate 12/03/18 2127 85     Resp 12/03/18 2127 16  Temp 12/03/18 2127 98.4 F (36.9 C)     Temp Source 12/03/18 2127 Oral     SpO2 12/03/18 2127 100 %     Weight 12/03/18 2128 145 lb (65.8 kg)     Height 12/03/18 2128 5\' 6"  (1.676 m)     Head Circumference --      Peak Flow --      Pain Score 12/03/18 2127 2     Pain Loc --      Pain Edu? --      Excl. in GC? --      Constitutional: Alert and oriented. Well appearing and in no acute distress. Eyes: Conjunctivae are normal. PERRL. EOMI. Head: Atraumatic. Neck: No stridor.     Cardiovascular: Normal rate, regular rhythm. Normal S1 and S2.  Good peripheral circulation. Respiratory: Normal respiratory effort without tachypnea or retractions. Lungs CTAB. Good air entry to the bases with no decreased or absent breath sounds. Musculoskeletal: Full range of motion to all extremities. No gross deformities appreciated.  Visualization of the ring finger left hand reveals gross edema and erythema to the proximal phalanx.  Patient has 2 rings that are unable to be removed to this digit.  They are involved in the edema.  Edema is so great that they are pushing around the metallic body of the rings.  No active bleeding.  No drainage.  Evaluation after removal of the rings reveals that edema was great enough to cause the metal surface of the ring to cause laceration/opening of the epidermis.  No tendon involvement.  No bleeding or purulent drainage from the digit.  Patient has full range of motion to the digit with full extension, flexion.  Capillary refill less than 2 seconds. Neurologic:  Normal speech and language. No gross focal neurologic deficits are appreciated.  Skin:  Skin is warm, dry and intact. No rash noted. Psychiatric: Mood and affect are normal. Speech and behavior are normal. Patient exhibits appropriate insight and judgement.   ____________________________________________   LABS (all labs ordered are listed, but only abnormal results are displayed)  Labs Reviewed - No data to display ____________________________________________  EKG   ____________________________________________  RADIOLOGY   No results found.  ____________________________________________    PROCEDURES  Procedure(s) performed:    .Foreign Body Removal Date/Time: 12/03/2018 10:09 PM Performed by: Racheal Patches, PA-C Authorized by: Racheal Patches, PA-C  Consent: Verbal consent obtained. Risks and benefits: risks, benefits and alternatives were discussed Consent  given by: patient Patient understanding: patient states understanding of the procedure being performed Patient identity confirmed: verbally with patient Time out: Immediately prior to procedure a "time out" was called to verify the correct patient, procedure, equipment, support staff and site/side marked as required.  Sedation: Patient sedated: no  Patient restrained: no Patient cooperative: yes Complexity: simple Post-procedure assessment: foreign body removed Comments: Patient presents emergency department with 2 rings to the left finger.  Patient has gross edema of the proximal phalanx with restricting properties of in place wedding rings.  Using electric ring cutter with diamond wheel, these are successfully removed.  No underlying trauma to the digit from removal attempts.  Patient does have skin changes consistent with skin laceration/penetration from metal other retained foreign body.        Medications  clindamycin (CLEOCIN) capsule 300 mg (300 mg Oral Given 12/03/18 2230)     ____________________________________________   INITIAL IMPRESSION / ASSESSMENT AND PLAN / ED COURSE  Pertinent labs & imaging results that were available during  my care of the patient were reviewed by me and considered in my medical decision making (see chart for details).  Review of the [place] CSRS was performed in accordance of the [facility] prior to dispensing any controlled drugs.      Patient's diagnosis is consistent with cellulitis of the left ring finger, foreign body of ring finger with infection.  Patient presents the emergency department with significant edema and erythema to the proximal phalanx of the ring finger left hand.  Patient had 2 rings on the finger that was unable to be removed.  These were removed using electric ring cutter.  No further trauma secondary to removal efforts.  Patient did have laceration/skin disruption from the rings pressing into edematous tissue.  It appears that patient  likely had a mild allergic reaction with skin disruption leading to infection of the ring digit.  No sign of infectious tenosynovitis.  No sign of tendinous rupture.  Patient will be covered with clindamycin.  Wound care instructions discussed with patient.  Patient is advised not to place any rings on this digit until complete resolution of symptoms.  Follow-up with primary care or hand surgery as needed..Patient is given ED precautions to return to the ED for any worsening or new symptoms.     ____________________________________________  FINAL CLINICAL IMPRESSION(S) / ED DIAGNOSES  Final diagnoses:  Cellulitis of left ring finger  Foreign body of left ring finger with infection      NEW MEDICATIONS STARTED DURING THIS VISIT:  ED Discharge Orders         Ordered    clindamycin (CLEOCIN) 300 MG capsule  4 times daily     12/03/18 2218    mupirocin cream (BACTROBAN) 2 %     12/03/18 2218              This chart was dictated using voice recognition software/Dragon. Despite best efforts to proofread, errors can occur which can change the meaning. Any change was purely unintentional.    Racheal Patches, PA-C 12/04/18 0005    Sharman Cheek, MD 12/09/18 [phone]

## 2018-12-03 NOTE — ED Notes (Signed)
E-signature pad not functional at this time. 

## 2018-12-03 NOTE — ED Notes (Signed)
4th finger on left hand is swollen with open area on dorsum. Patient tolerated ring removal well.

## 2018-12-03 NOTE — ED Notes (Signed)
4th finger on left hand was cleaned with soap and water and wrapped with gauze and kling. Patient tolerated procedure well.

## 2019-01-29 ENCOUNTER — Other Ambulatory Visit: Payer: Self-pay | Admitting: Family Medicine

## 2019-01-30 ENCOUNTER — Other Ambulatory Visit: Payer: Self-pay

## 2019-01-30 ENCOUNTER — Telehealth: Payer: Self-pay | Admitting: Endocrinology

## 2019-01-30 DIAGNOSIS — E109 Type 1 diabetes mellitus without complications: Secondary | ICD-10-CM

## 2019-01-30 MED ORDER — BASAGLAR KWIKPEN 100 UNIT/ML ~~LOC~~ SOPN: 8.0000 [IU] | PEN_INJECTOR | Freq: Every day | SUBCUTANEOUS | 0 refills | Status: DC

## 2019-01-30 NOTE — Telephone Encounter (Signed)
Insulin Glargine (BASAGLAR KWIKPEN) 100 UNIT/ML SOPN 1 pen 0 01/30/2019    Sig - Route: Inject 0.08 mLs (8 Units total) into the skin at bedtime. - Subcutaneous   Sent to pharmacy as: Insulin Glargine (BASAGLAR KWIKPEN) 100 UNIT/ML Solution Pen-injector   Notes to Pharmacy: Over due for an office visit. Will supply ONLY 30 day supply to allow pt time to schedule an appt   E-Prescribing Status: Receipt confirmed by pharmacy (01/30/2019 2:22 PM EDT)    Pt overdue for an office visit. Was advised to follow up 2-3 months after her Sept 2019 appt. 30 day supply sent to allow time for pt to call to schedule an appt.

## 2019-01-30 NOTE — Telephone Encounter (Signed)
MEDICATION: Insulin Glargine (BASAGLAR KWIKPEN) 100 UNIT/ML SOPN  PHARMACY:  [id number] - [place] ([place]), [place] - [address]  IS THIS A 90 DAY SUPPLY :   IS PATIENT OUT OF MEDICATION:   IF NOT; HOW MUCH IS LEFT: 2 pens  LAST APPOINTMENT DATE: @11 /25/2019  NEXT APPOINTMENT DATE:@Visit  date not found  DO WE HAVE YOUR PERMISSION TO LEAVE A DETAILED MESSAGE:  OTHER COMMENTS:    **Let patient know to contact pharmacy at the end of the day to make sure medication is ready. **  ** Please notify patient to allow 48-72 hours to process**  **Encourage patient to contact the pharmacy for refills or they can request refills through [facility]**

## 2019-01-31 ENCOUNTER — Telehealth: Payer: Self-pay | Admitting: Family Medicine

## 2019-01-31 NOTE — Telephone Encounter (Signed)
Pt called and is requesting a refill on her lisinopril pt is doing a virtual office visit on Wednesday for a medcheck please send to [id number] - [place] ([place]), [place] - [address]

## 2019-01-31 NOTE — Telephone Encounter (Signed)
We will need to address if she has been taking this or has been out of medication on Wednesday as well as what her BP has been at home. Thanks.

## 2019-02-01 ENCOUNTER — Ambulatory Visit: Payer: 59 | Admitting: Family Medicine

## 2019-02-01 ENCOUNTER — Encounter: Payer: Self-pay | Admitting: Family Medicine

## 2019-02-01 ENCOUNTER — Other Ambulatory Visit: Payer: Self-pay

## 2019-02-01 VITALS — BP 136/90 | Wt 120.0 lb

## 2019-02-01 DIAGNOSIS — E559 Vitamin D deficiency, unspecified: Secondary | ICD-10-CM

## 2019-02-01 DIAGNOSIS — I10 Essential (primary) hypertension: Secondary | ICD-10-CM

## 2019-02-01 DIAGNOSIS — F17200 Nicotine dependence, unspecified, uncomplicated: Secondary | ICD-10-CM

## 2019-02-01 HISTORY — DX: Nicotine dependence, unspecified, uncomplicated: F17.200

## 2019-02-01 HISTORY — DX: Vitamin D deficiency, unspecified: E55.9

## 2019-02-01 MED ORDER — LISINOPRIL 10 MG PO TABS: 10.0000 mg | ORAL_TABLET | Freq: Every day | ORAL | 2 refills | Status: DC

## 2019-02-01 NOTE — Telephone Encounter (Signed)
Pt has an appt today. Will address today

## 2019-02-01 NOTE — Progress Notes (Signed)
   Subjective:   Interactive audio and video telecommunications were attempted between this provider and patient, however due to patient not having access to video capability, we continued and completed visit with audio only.  The patient was located at home. 2 patient identifiers used.  The provider was located in the office. The patient did consent to this visit and is aware of possible charges through their insurance for this visit.  The other persons participating in this telemedicine service were none.    Patient ID: Alyssa Norman, female    DOB: Apr 19, 1976, 42 y.o.   MRN: [id number]  HPI Chief Complaint  Patient presents with  . med check    med check. will be out of bp med today.    She is overdue for follow up on chronic health conditions. New to me in August 2019 and I referred her to endocrinology for type 1 diabetes at that time.   I still do not have medical records from her previous PCP.   HTN- diagnosed 7 years ago. Has been on lisinopril 10 mg since. Does not check BP regularly.  Reports no missed doses with medication. States she eats a low sodium diet. Does not exercise.   Vitamin D deficiency and has been taking an OTC supplement.   Diabetes is managed by Dr. Everardo All although she has not followed up as recommended. She did call and get insulin refilled by them.   Smoking and is considering stopping.  States she has tried to stop several times in the past. States she has tried Chantix, nicotine patches, gum etc.  Questions whether Wellbutrin could work for her.   Denies fever, chills, dizziness, chest pain, palpitations, shortness of breath, abdominal pain, N/V/D, urinary symptoms, LE edema.    LMP: 3 days ago  Contraception none. States no chance of pregnancy.   Reviewed allergies, medications, past medical, surgical, family, and social history.   Review of Systems Pertinent positives and negatives in the history of present illness.     Objective:   Physical Exam BP 136/90   Wt 120 lb (54.4 kg)   LMP 01/29/2019 (Exact Date)   BMI 20.60 kg/m   Alert and oriented and in no acute distress. Respirations are unlabored. Normal speech, mood.       Assessment & Plan:  Essential hypertension - Plan: lisinopril (ZESTRIL) 10 MG tablet  Smoker  Vitamin D deficiency  Discussed limitations of virtual visit.  She is aware that her BP is elevated today. States she ran upstairs to check her BP before I called. Does not regularly check BP.  Will continue on current dose of lisinopril for now but she will keep an eye on her BP and if not in goal range consistently she will let me know and we can increase lisinopril dose.  Counseling on low sodium diet.  Encouraged her to stop smoking by getting a plan together. She can schedule a stop date. May call back to discuss Wellbutrin if she would like.  Continue on vitamin D or multivitamin.  Asked her to come in for labs. She declines and would like to wait.  Follow up in 3 months or sooner if needed.   Time spent on call was 15 minutes and in review of previous records 2 minutes total.  This virtual service is not related to other E/M service within previous 7 days.

## 2019-03-11 ENCOUNTER — Other Ambulatory Visit: Payer: Self-pay | Admitting: Endocrinology

## 2019-03-11 ENCOUNTER — Other Ambulatory Visit: Payer: Self-pay | Admitting: Family Medicine

## 2019-03-11 DIAGNOSIS — E109 Type 1 diabetes mellitus without complications: Secondary | ICD-10-CM

## 2019-03-11 NOTE — Telephone Encounter (Signed)
Please refill x 1 F/u is due  

## 2019-03-13 NOTE — Telephone Encounter (Signed)
Pt sees endo. I will defer to endocrinology

## 2019-04-12 ENCOUNTER — Other Ambulatory Visit: Payer: Self-pay | Admitting: Endocrinology

## 2019-04-12 DIAGNOSIS — E109 Type 1 diabetes mellitus without complications: Secondary | ICD-10-CM

## 2019-04-24 ENCOUNTER — Encounter: Payer: Self-pay | Admitting: Internal Medicine

## 2019-04-24 ENCOUNTER — Other Ambulatory Visit: Payer: Self-pay

## 2019-04-24 MED ORDER — LOSARTAN POTASSIUM 50 MG PO TABS: 50.0000 mg | ORAL_TABLET | Freq: Every day | ORAL | 1 refills | Status: DC

## 2019-05-08 ENCOUNTER — Encounter: Payer: Self-pay | Admitting: Internal Medicine

## 2019-05-08 ENCOUNTER — Ambulatory Visit: Payer: Self-pay | Admitting: Internal Medicine

## 2019-05-08 ENCOUNTER — Other Ambulatory Visit: Payer: Self-pay

## 2019-05-08 VITALS — BP 114/72 | HR 74 | Temp 98.4°F | Ht 66.0 in | Wt 150.6 lb

## 2019-05-08 DIAGNOSIS — I10 Essential (primary) hypertension: Secondary | ICD-10-CM | POA: Diagnosis not present

## 2019-05-08 DIAGNOSIS — Z0000 Encounter for general adult medical examination without abnormal findings: Secondary | ICD-10-CM

## 2019-05-08 LAB — POCT UA - MICROALBUMIN
Albumin/Creatinine Ratio, Urine, POC: <30
Creatinine, POC: 100 mg/dL
Microalbumin Ur, POC: 10 mg/L

## 2019-05-08 LAB — POCT URINALYSIS DIPSTICK
Bilirubin, UA: NEGATIVE
Glucose, UA: NEGATIVE
Ketones, UA: NEGATIVE
Leukocytes, UA: NEGATIVE
Nitrite, UA: NEGATIVE
Protein, UA: NEGATIVE
Spec Grav, UA: 1.025 (ref 1.010–1.025)
Urobilinogen, UA: 0.2 E.U./dL
pH, UA: 5.5 (ref 5.0–8.0)

## 2019-05-08 NOTE — Patient Instructions (Signed)
Health Maintenance, Female Adopting a healthy lifestyle and getting preventive care are important in promoting health and wellness. Ask your health care provider about:  The right schedule for you to have regular tests and exams.  Things you can do on your own to prevent diseases and keep yourself healthy. What should I know about diet, weight, and exercise? Eat a healthy diet   Eat a diet that includes plenty of vegetables, fruits, low-fat dairy products, and lean protein.  Do not eat a lot of foods that are high in solid fats, added sugars, or sodium. Maintain a healthy weight Body mass index (BMI) is used to identify weight problems. It estimates body fat based on height and weight. Your health care provider can help determine your BMI and help you achieve or maintain a healthy weight. Get regular exercise Get regular exercise. This is one of the most important things you can do for your health. Most adults should:  Exercise for at least 150 minutes each week. The exercise should increase your heart rate and make you sweat (moderate-intensity exercise).  Do strengthening exercises at least twice a week. This is in addition to the moderate-intensity exercise.  Spend less time sitting. Even light physical activity can be beneficial. Watch cholesterol and blood lipids Have your blood tested for lipids and cholesterol at 43 years of age, then have this test every 5 years. Have your cholesterol levels checked more often if:  Your lipid or cholesterol levels are high.  You are older than 43 years of age.  You are at high risk for heart disease. What should I know about cancer screening? Depending on your health history and family history, you may need to have cancer screening at various ages. This may include screening for:  Breast cancer.  Cervical cancer.  Colorectal cancer.  Skin cancer.  Lung cancer. What should I know about heart disease, diabetes, and high blood  pressure? Blood pressure and heart disease  High blood pressure causes heart disease and increases the risk of stroke. This is more likely to develop in people who have high blood pressure readings, are of African descent, or are overweight.  Have your blood pressure checked: ? Every 3-5 years if you are 18-39 years of age. ? Every year if you are 40 years old or older. Diabetes Have regular diabetes screenings. This checks your fasting blood sugar level. Have the screening done:  Once every three years after age 40 if you are at a normal weight and have a low risk for diabetes.  More often and at a younger age if you are overweight or have a high risk for diabetes. What should I know about preventing infection? Hepatitis B If you have a higher risk for hepatitis B, you should be screened for this virus. Talk with your health care provider to find out if you are at risk for hepatitis B infection. Hepatitis C Testing is recommended for:  Everyone born from 1945 through 1965.  Anyone with known risk factors for hepatitis C. Sexually transmitted infections (STIs)  Get screened for STIs, including gonorrhea and chlamydia, if: ? You are sexually active and are younger than 43 years of age. ? You are older than 43 years of age and your health care provider tells you that you are at risk for this type of infection. ? Your sexual activity has changed since you were last screened, and you are at increased risk for chlamydia or gonorrhea. Ask your health care provider if   you are at risk.  Ask your health care provider about whether you are at high risk for HIV. Your health care provider may recommend a prescription medicine to help prevent HIV infection. If you choose to take medicine to prevent HIV, you should first get tested for HIV. You should then be tested every 3 months for as long as you are taking the medicine. Pregnancy  If you are about to stop having your period (premenopausal) and  you may become pregnant, seek counseling before you get pregnant.  Take 400 to 800 micrograms (mcg) of folic acid every day if you become pregnant.  Ask for birth control (contraception) if you want to prevent pregnancy. Osteoporosis and menopause Osteoporosis is a disease in which the bones lose minerals and strength with aging. This can result in bone fractures. If you are 65 years old or older, or if you are at risk for osteoporosis and fractures, ask your health care provider if you should:  Be screened for bone loss.  Take a calcium or vitamin D supplement to lower your risk of fractures.  Be given hormone replacement therapy (HRT) to treat symptoms of menopause. Follow these instructions at home: Lifestyle  Do not use any products that contain nicotine or tobacco, such as cigarettes, e-cigarettes, and chewing tobacco. If you need help quitting, ask your health care provider.  Do not use street drugs.  Do not share needles.  Ask your health care provider for help if you need support or information about quitting drugs. Alcohol use  Do not drink alcohol if: ? Your health care provider tells you not to drink. ? You are pregnant, may be pregnant, or are planning to become pregnant.  If you drink alcohol: ? Limit how much you use to 0-1 drink a day. ? Limit intake if you are breastfeeding.  Be aware of how much alcohol is in your drink. In the [place] one drink equals one 12 oz bottle of beer (355 mL), one 5 oz glass of wine (148 mL), or one 1 oz glass of hard liquor (44 mL). General instructions  Schedule regular health, dental, and eye exams.  Stay current with your vaccines.  Tell your health care provider if: ? You often feel depressed. ? You have ever been abused or do not feel safe at home. Summary  Adopting a healthy lifestyle and getting preventive care are important in promoting health and wellness.  Follow your health care provider's instructions about healthy  diet, exercising, and getting tested or screened for diseases.  Follow your health care provider's instructions on monitoring your cholesterol and blood pressure. This information is not intended to replace advice given to you by your health care provider. Make sure you discuss any questions you have with your health care provider. Document Released: 04/13/2011 Document Revised: 09/21/2018 Document Reviewed: 09/21/2018 Elsevier Patient Education  2020 [employer]vier Inc.  

## 2019-05-08 NOTE — Progress Notes (Signed)
Subjective:     Patient ID: Cheryl Hill , female    DOB: 04/18/1976 , 43 y.o.   MRN: [id number]   Chief Complaint  Patient presents with  . Annual Exam  . Hypertension    HPI  She is here today for a full physical examination. She is followed by for her Marlinda Mikeanya Bailey, midwife for her GYN exams. She reports her last visit was within last two years.   Hypertension This is a chronic problem. The current episode started more than 1 year ago. The problem has been gradually improving since onset. The problem is controlled. Pertinent negatives include no blurred vision, chest pain, palpitations or shortness of breath. Past treatments include angiotensin blockers. The current treatment provides moderate improvement.     Past Medical History:  Diagnosis Date  . Atopic dermatitis 11/10/2016  . Diabetes mellitus without complication (HCC)   . Hypertension      Family History  Problem Relation Age of Onset  . Diabetes Mother   . Hypertension Mother   . Hyperlipidemia Mother   . Hyperlipidemia Father   . Hypertension Father   . Prostate cancer Father   . Diabetes Father      Current Outpatient Medications:  .  etonogestrel (NEXPLANON) 68 MG IMPL implant, Inject into the skin., Disp: , Rfl:  .  losartan (COZAAR) 50 MG tablet, Take 1 tablet (50 mg total) by mouth daily., Disp: 90 tablet, Rfl: 1   No Known Allergies    The patient states she uses Nexplanon for birth control. Last LMP was Patient's last menstrual period was 05/06/2019.. Negative for Dysmenorrhea  Negative for: breast discharge, breast lump(s), breast pain and breast self exam. Associated symptoms include abnormal vaginal bleeding. Pertinent negatives include abnormal bleeding (hematology), anxiety, decreased libido, depression, difficulty falling sleep, dyspareunia, history of infertility, nocturia, sexual dysfunction, sleep disturbances, urinary incontinence, urinary urgency, vaginal discharge and vaginal  itching. Diet regular.The patient states her exercise level is  twice weekly.   . The patient's tobacco use is:  Social History   Tobacco Use  Smoking Status Never Smoker  Smokeless Tobacco Never Used  . She has been exposed to passive smoke. The patient's alcohol use is:  Social History   Substance and Sexual Activity  Alcohol Use Never  . Frequency: Never   Review of Systems  Constitutional: Negative.   HENT: Negative.   Eyes: Negative.  Negative for blurred vision.  Respiratory: Negative.  Negative for shortness of breath.   Cardiovascular: Negative.  Negative for chest pain and palpitations.  Endocrine: Negative.   Genitourinary: Negative.   Musculoskeletal: Negative.   Skin: Negative.   Allergic/Immunologic: Negative.   Neurological: Negative.   Hematological: Negative.   Psychiatric/Behavioral: Negative.      Today's Vitals   05/08/19 0851  BP: 114/72  Pulse: 74  Temp: 98.4 F (36.9 C)  TempSrc: Oral  Weight: 150 lb 9.6 oz (68.3 kg)  Height: 5\' 6"  (1.676 m)   Body mass index is 24.31 kg/m.   Objective:  Physical Exam Vitals signs and nursing note reviewed.  Constitutional:      Appearance: Normal appearance.  HENT:     Head: Normocephalic and atraumatic.     Right Ear: Tympanic membrane, ear canal and external ear normal.     Left Ear: Tympanic membrane, ear canal and external ear normal.     Nose: Nose normal.     Mouth/Throat:     Mouth: Mucous membranes are moist.  Pharynx: Oropharynx is clear.  Eyes:     Extraocular Movements: Extraocular movements intact.     Conjunctiva/sclera: Conjunctivae normal.     Pupils: Pupils are equal, round, and reactive to light.  Neck:     Musculoskeletal: Normal range of motion and neck supple.  Cardiovascular:     Rate and Rhythm: Normal rate and regular rhythm.     Pulses: Normal pulses.     Heart sounds: Normal heart sounds.  Pulmonary:     Effort: Pulmonary effort is normal.     Breath sounds: Normal  breath sounds.  Chest:     Breasts: Tanner Score is 5.        Right: Normal. No swelling, bleeding, inverted nipple, mass, nipple discharge or skin change.        Left: Normal. No swelling, bleeding, inverted nipple, mass, nipple discharge or skin change.  Abdominal:     General: Abdomen is flat. Bowel sounds are normal.     Palpations: Abdomen is soft.  Genitourinary:    Comments: deferred Musculoskeletal: Normal range of motion.  Skin:    General: Skin is warm and dry.  Neurological:     General: No focal deficit present.     Mental Status: She is alert and oriented to person, place, and time.  Psychiatric:        Mood and Affect: Mood normal.        Behavior: Behavior normal.         Assessment And Plan:     1. Routine general medical examination at health care facility  A full exam was performed.  Importance of monthly self breast exams was discussed with the patient. PATIENT HAS BEEN ADVISED TO GET 30-45 MINUTES REGULAR EXERCISE NO LESS THAN FOUR TO FIVE DAYS PER WEEK - BOTH WEIGHTBEARING EXERCISES AND AEROBIC ARE RECOMMENDED.  HE/SHE WAS ADVISED TO FOLLOW A HEALTHY DIET WITH AT LEAST SIX FRUITS/VEGGIES PER DAY, DECREASE INTAKE OF RED MEAT, AND TO INCREASE FISH INTAKE TO TWO DAYS PER WEEK.  MEATS/FISH SHOULD NOT BE FRIED, BAKED OR BROILED IS PREFERABLE.  I SUGGEST WEARING SPF 50 SUNSCREEN ON EXPOSED PARTS AND ESPECIALLY WHEN IN THE DIRECT SUNLIGHT FOR AN EXTENDED PERIOD OF TIME.  PLEASE AVOID FAST FOOD RESTAURANTS AND INCREASE YOUR WATER INTAKE.   2. Benign essential hypertension  Well controlled. She will continue with current meds.  EKG performed, no acute changes noted.   - EKG 12-Lead        Maximino Greenland, MD    THE PATIENT IS ENCOURAGED TO PRACTICE SOCIAL DISTANCING DUE TO THE COVID-19 PANDEMIC.

## 2019-05-09 ENCOUNTER — Other Ambulatory Visit: Payer: Self-pay | Admitting: Internal Medicine

## 2019-05-09 DIAGNOSIS — Z1231 Encounter for screening mammogram for malignant neoplasm of breast: Secondary | ICD-10-CM

## 2019-05-09 LAB — CBC
Hematocrit: 43.4 % (ref 34.0–46.6)
Hemoglobin: 14.1 g/dL (ref 11.1–15.9)
MCH: 26.3 pg — ABNORMAL LOW (ref 26.6–33.0)
MCHC: 32.5 g/dL (ref 31.5–35.7)
MCV: 81 fL (ref 79–97)
Platelets: 283 10*3/uL (ref 150–450)
RBC: 5.37 x10E6/uL — ABNORMAL HIGH (ref 3.77–5.28)
RDW: 13.3 % (ref 11.7–15.4)
WBC: 6.6 10*3/uL (ref 3.4–10.8)

## 2019-05-09 LAB — LIPID PANEL
Chol/HDL Ratio: 3.2 ratio (ref 0.0–4.4)
Cholesterol, Total: 145 mg/dL (ref 100–199)
HDL: 45 mg/dL (ref >39)
LDL Calculated: 91 mg/dL (ref 0–99)
Triglycerides: 46 mg/dL (ref 0–149)
VLDL Cholesterol Cal: 9 mg/dL (ref 5–40)

## 2019-05-09 LAB — CMP14+EGFR
ALT: 11 IU/L (ref 0–32)
AST: 15 IU/L (ref 0–40)
Albumin/Globulin Ratio: 1.7 (ref 1.2–2.2)
Albumin: 4.3 g/dL (ref 3.8–4.8)
Alkaline Phosphatase: 39 IU/L (ref 39–117)
BUN/Creatinine Ratio: 14 (ref 9–23)
BUN: 12 mg/dL (ref 6–24)
Bilirubin Total: 0.3 mg/dL (ref 0.0–1.2)
CO2: 22 mmol/L (ref 20–29)
Calcium: 9.3 mg/dL (ref 8.7–10.2)
Chloride: 104 mmol/L (ref 96–106)
Creatinine, Ser: 0.87 mg/dL (ref 0.57–1.00)
GFR calc Af Amer: 95 mL/min/{1.73_m2} (ref >59)
GFR calc non Af Amer: 82 mL/min/{1.73_m2} (ref >59)
Globulin, Total: 2.5 g/dL (ref 1.5–4.5)
Glucose: 86 mg/dL (ref 65–99)
Potassium: 4.2 mmol/L (ref 3.5–5.2)
Sodium: 140 mmol/L (ref 134–144)
Total Protein: 6.8 g/dL (ref 6.0–8.5)

## 2019-05-09 LAB — TSH: TSH: 0.909 u[IU]/mL (ref 0.450–4.500)

## 2019-05-09 LAB — HEMOGLOBIN A1C
Est. average glucose Bld gHb Est-mCnc: 108 mg/dL
Hgb A1c MFr Bld: 5.4 % (ref 4.8–5.6)

## 2019-05-12 ENCOUNTER — Telehealth: Payer: Self-pay | Admitting: Family Medicine

## 2019-05-12 NOTE — Telephone Encounter (Signed)
Pt called and said her Insurance sent her a letter stating that they will no longer pay for Basaglar but they will pay for Lantus. Pt stated that she is not out of the medication yet but she uses the [facility] on [address]

## 2019-05-14 NOTE — Telephone Encounter (Signed)
She will need to address this with her endocrinologist who is treating her diabetes.

## 2019-05-15 NOTE — Telephone Encounter (Signed)
lmom informing patient of message from provider.

## 2019-05-25 ENCOUNTER — Telehealth: Payer: Self-pay | Admitting: Family Medicine

## 2019-05-25 MED ORDER — LANTUS SOLOSTAR 100 UNIT/ML ~~LOC~~ SOPN: 8.0000 [IU] | PEN_INJECTOR | Freq: Every day | SUBCUTANEOUS | 99 refills | Status: DC

## 2019-05-25 NOTE — Telephone Encounter (Signed)
Ok to switch her to Lantus. Same dose as Basaglar.

## 2019-05-25 NOTE — Telephone Encounter (Signed)
Done

## 2019-05-25 NOTE — Telephone Encounter (Signed)
Pt called and states that she received a letter from her insurance stating that her insurance is no longer going to cover Victoria and will now cover Lantus. Please send lantus to Alyssa Norman.

## 2019-06-23 ENCOUNTER — Ambulatory Visit
Admission: RE | Admit: 2019-06-23 | Discharge: 2019-06-23 | Disposition: A | Discharge status: Home / Self Care | Payer: BC Managed Care – PPO | Source: Ambulatory Visit | Attending: Internal Medicine | Admitting: Internal Medicine

## 2019-06-23 DIAGNOSIS — Z1231 Encounter for screening mammogram for malignant neoplasm of breast: Secondary | ICD-10-CM | POA: Diagnosis not present

## 2019-06-26 ENCOUNTER — Other Ambulatory Visit: Payer: Self-pay | Admitting: Internal Medicine

## 2019-06-26 DIAGNOSIS — N6489 Other specified disorders of breast: Secondary | ICD-10-CM

## 2019-06-26 DIAGNOSIS — R928 Other abnormal and inconclusive findings on diagnostic imaging of breast: Secondary | ICD-10-CM

## 2019-07-07 ENCOUNTER — Ambulatory Visit
Admission: RE | Admit: 2019-07-07 | Discharge: 2019-07-07 | Disposition: A | Discharge status: Home / Self Care | Payer: BC Managed Care – PPO | Source: Ambulatory Visit | Attending: Internal Medicine | Admitting: Internal Medicine

## 2019-07-07 DIAGNOSIS — N6489 Other specified disorders of breast: Secondary | ICD-10-CM

## 2019-07-07 DIAGNOSIS — R928 Other abnormal and inconclusive findings on diagnostic imaging of breast: Secondary | ICD-10-CM | POA: Diagnosis not present

## 2019-07-23 DIAGNOSIS — Z23 Encounter for immunization: Secondary | ICD-10-CM | POA: Diagnosis not present

## 2019-08-17 ENCOUNTER — Other Ambulatory Visit: Payer: Self-pay | Admitting: Family Medicine

## 2019-08-17 DIAGNOSIS — I10 Essential (primary) hypertension: Secondary | ICD-10-CM

## 2019-08-17 NOTE — Telephone Encounter (Signed)
Pt will call back to schedule a virtual med check since she is checking her bps. She will call back to schedule this. I will give her 30 days only

## 2019-09-13 DIAGNOSIS — H04123 Dry eye syndrome of bilateral lacrimal glands: Secondary | ICD-10-CM | POA: Diagnosis not present

## 2019-09-13 DIAGNOSIS — D3131 Benign neoplasm of right choroid: Secondary | ICD-10-CM | POA: Diagnosis not present

## 2019-09-13 DIAGNOSIS — H16103 Unspecified superficial keratitis, bilateral: Secondary | ICD-10-CM | POA: Diagnosis not present

## 2019-09-13 DIAGNOSIS — H5213 Myopia, bilateral: Secondary | ICD-10-CM | POA: Diagnosis not present

## 2019-10-12 IMAGING — MG MM DIGITAL SCREENING BILAT W/ TOMO W/ CAD
8 series · 9 of 24 positions shown · non-contrast
Comparison: Previous exam(s).

CLINICAL DATA: Screening.

EXAM:
DIGITAL SCREENING BILATERAL MAMMOGRAM WITH TOMO AND CAD

[R CC synth-2D]
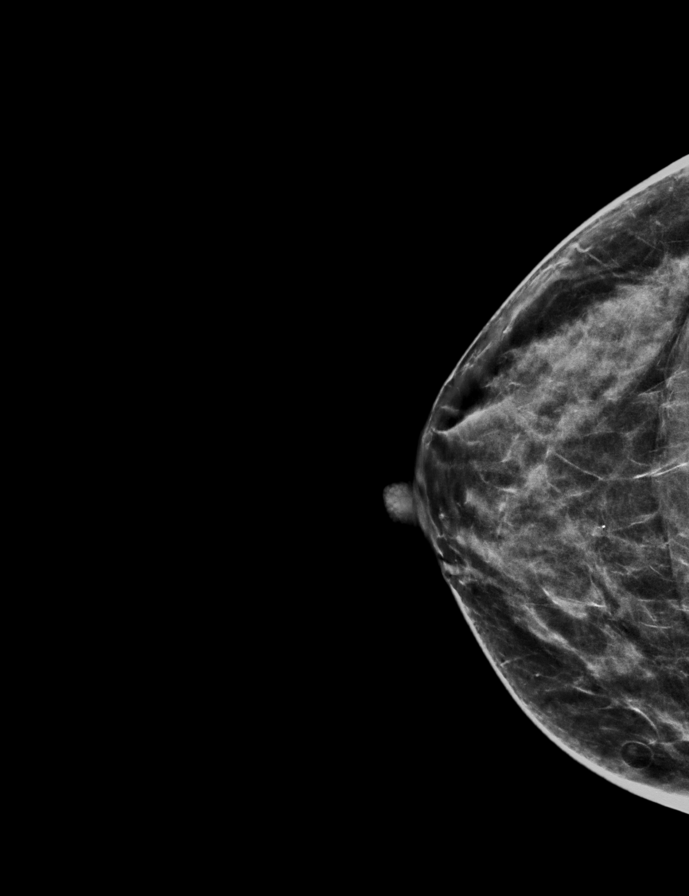

[L CC synth-2D]
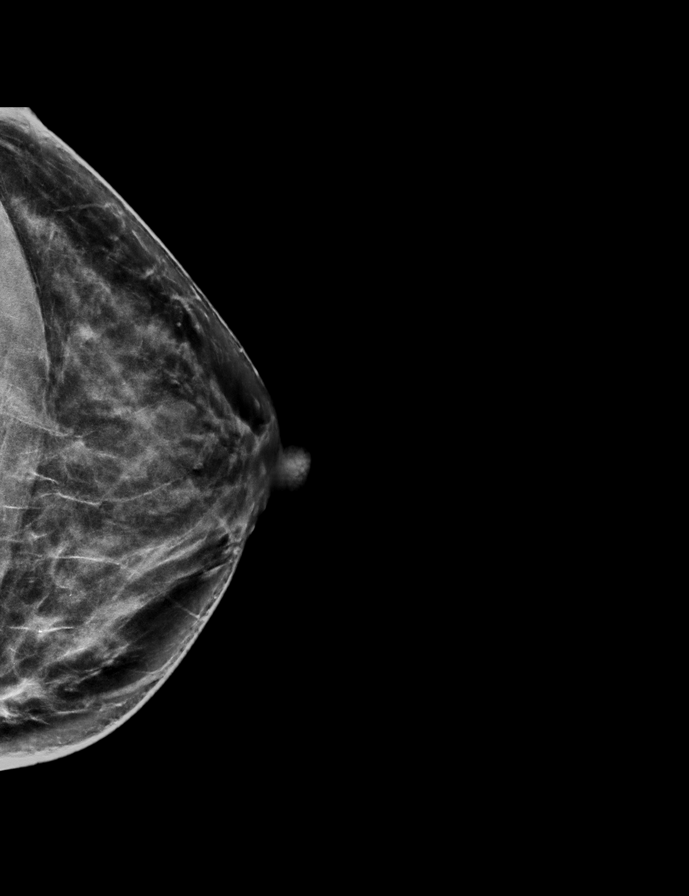

[R MLO synth-2D]
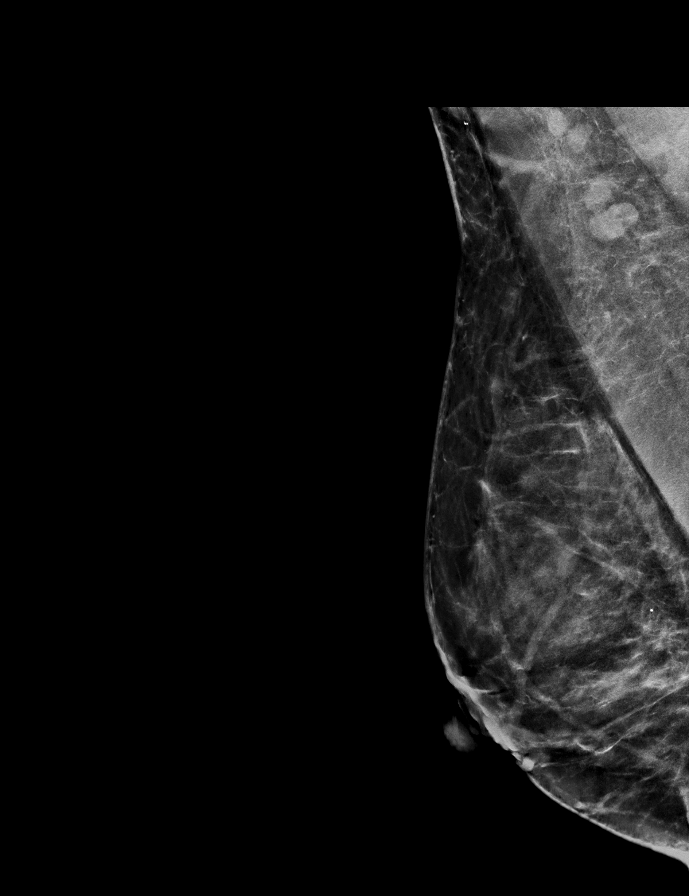

[L MLO synth-2D]
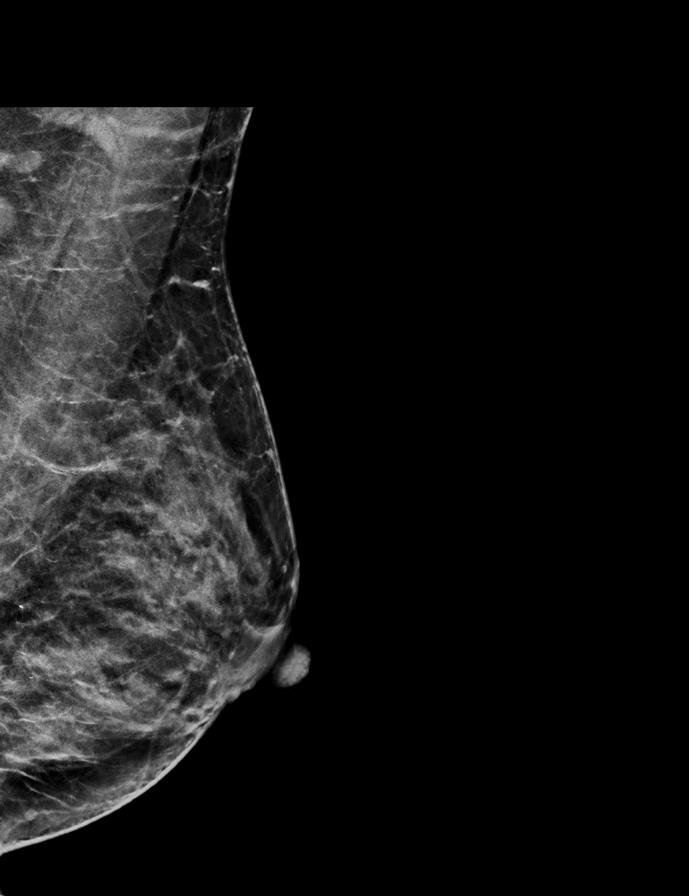

[R CC tomo · 2 of 58 frames shown]
[frame 19/58]
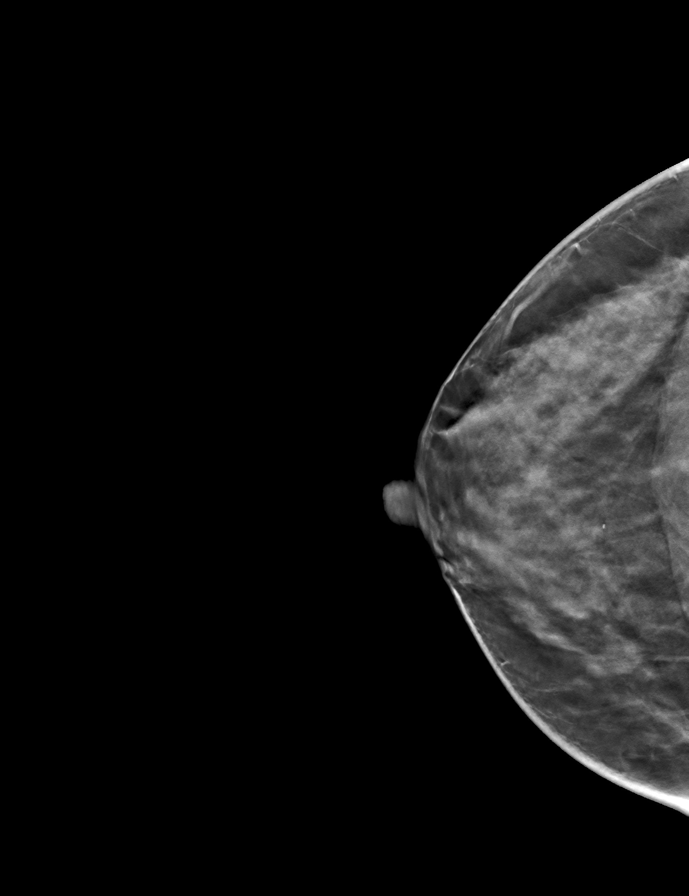
[frame 29/58]
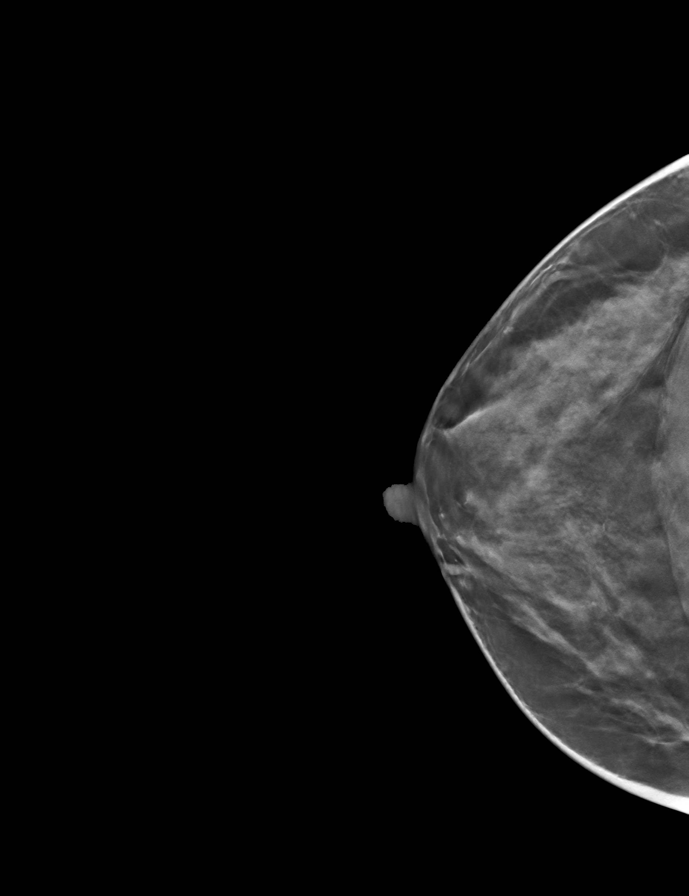

[L MLO tomo · tomo slice 30/59.0]
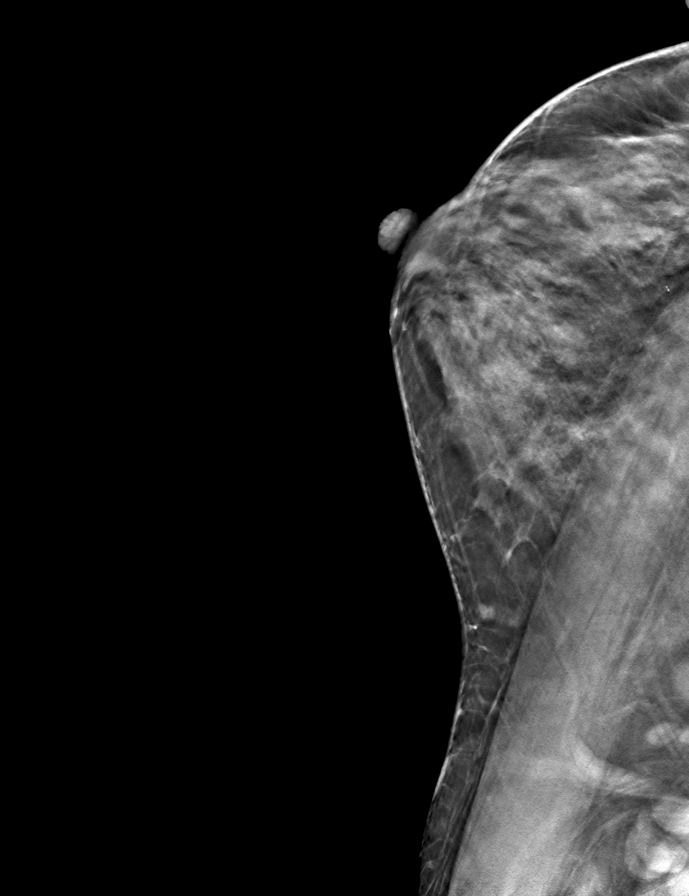

[R MLO tomo · tomo slice 35/69.0]
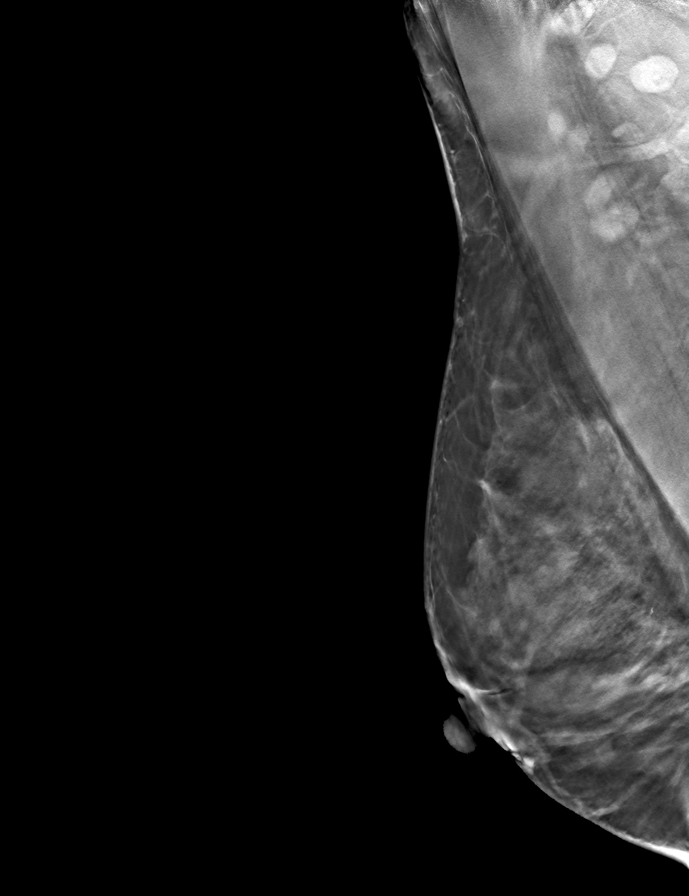

[L CC tomo · tomo slice 33/66.0]
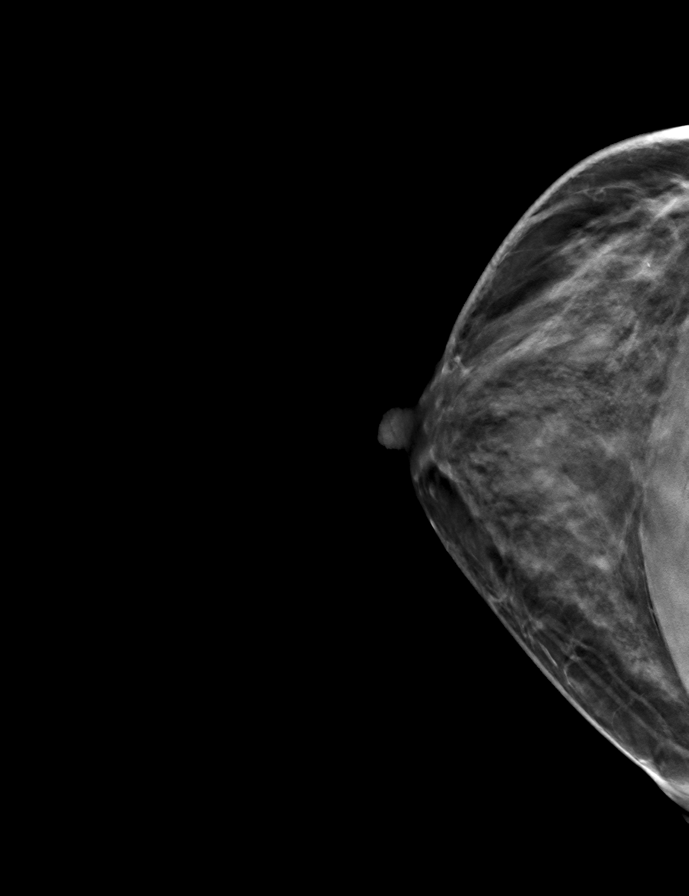

[9 of 24 positions shown; findings below may reference images not displayed]

ACR Breast Density Category c: The breast tissue is heterogeneously
dense, which may obscure small masses.
FINDINGS: In the right breast, a possible asymmetry warrants further
evaluation. This possible asymmetry is seen within the inner RIGHT
breast, at posterior depth, CC view only, slice 37.

In the left breast, no findings suspicious for malignancy. Images
were processed with CAD.
IMPRESSION: Further evaluation is suggested for possible asymmetry in the right
breast.

RECOMMENDATION:
Diagnostic mammogram and possibly ultrasound of the right breast.
(Code:[id number])

The patient will be contacted regarding the findings, and additional
imaging will be scheduled.

BI-RADS CATEGORY  0: Incomplete. Need additional imaging evaluation
and/or prior mammograms for comparison.

## 2019-10-20 ENCOUNTER — Other Ambulatory Visit: Payer: Self-pay | Admitting: Internal Medicine

## 2019-11-08 ENCOUNTER — Other Ambulatory Visit: Payer: Self-pay

## 2019-11-08 ENCOUNTER — Encounter: Payer: Self-pay | Admitting: Internal Medicine

## 2019-11-08 ENCOUNTER — Ambulatory Visit: Payer: BC Managed Care – PPO | Admitting: Internal Medicine

## 2019-11-08 VITALS — BP 122/86 | HR 85 | Temp 98.5°F | Ht 66.0 in | Wt 144.4 lb

## 2019-11-08 DIAGNOSIS — I10 Essential (primary) hypertension: Secondary | ICD-10-CM | POA: Diagnosis not present

## 2019-11-08 DIAGNOSIS — Z79899 Other long term (current) drug therapy: Secondary | ICD-10-CM

## 2019-11-08 MED ORDER — LOSARTAN POTASSIUM 50 MG PO TABS: 50.0000 mg | ORAL_TABLET | Freq: Every day | ORAL | 2 refills | Status: DC

## 2019-11-08 NOTE — Patient Instructions (Signed)
COVID-19 Vaccine Information can be found at: [email or web address] For questions related to vaccine distribution or appointments, please email [email or web address] or call [phone].    

## 2019-11-08 NOTE — Progress Notes (Signed)
This visit occurred during the SARS-CoV-2 public health emergency.  Safety protocols were in place, including screening questions prior to the visit, additional usage of staff PPE, and extensive cleaning of exam room while observing appropriate contact time as indicated for disinfecting solutions.  Subjective:     Patient ID: Cheryl Hill , female    DOB: 1975/12/04 , 44 y.o.   MRN: [id number]   Chief Complaint  Patient presents with  . Hypertension    HPI  Hypertension This is a chronic problem. The current episode started more than 1 year ago. The problem has been gradually improving since onset. The problem is controlled. Pertinent negatives include no blurred vision, chest pain, palpitations or shortness of breath. Past treatments include angiotensin blockers. The current treatment provides moderate improvement.     Past Medical History:  Diagnosis Date  . Atopic dermatitis 11/10/2016  . Hypertension      Family History  Problem Relation Age of Onset  . Diabetes Mother   . Hypertension Mother   . Hyperlipidemia Mother   . Cataracts Mother   . Obesity Mother   . Hyperlipidemia Father   . Hypertension Father   . Prostate cancer Father   . Diabetes Father      Current Outpatient Medications:  .  etonogestrel (NEXPLANON) 68 MG IMPL implant, Inject into the skin., Disp: , Rfl:  .  losartan (COZAAR) 50 MG tablet, Take 1 tablet (50 mg total) by mouth daily., Disp: 90 tablet, Rfl: 2   No Known Allergies   Review of Systems  Constitutional: Negative.   Eyes: Negative for blurred vision.  Respiratory: Negative.  Negative for shortness of breath.   Cardiovascular: Negative.  Negative for chest pain and palpitations.  Gastrointestinal: Negative.   Neurological: Negative.   Psychiatric/Behavioral: Negative.      Today's Vitals   11/08/19 1026  BP: 122/86  Pulse: 85  Temp: 98.5 F (36.9 C)  TempSrc: Oral  Weight: 144 lb 6.4 oz (65.5 kg)  Height: '5\' 6"'$   (1.676 m)   Body mass index is 23.31 kg/m.   Objective:  Physical Exam Vitals and nursing note reviewed.  Constitutional:      Appearance: Normal appearance.  HENT:     Head: Normocephalic and atraumatic.  Cardiovascular:     Rate and Rhythm: Normal rate and regular rhythm.     Heart sounds: Normal heart sounds.  Pulmonary:     Effort: Pulmonary effort is normal.     Breath sounds: Normal breath sounds.  Skin:    General: Skin is warm.  Neurological:     General: No focal deficit present.     Mental Status: She is alert.  Psychiatric:        Mood and Affect: Mood normal.        Behavior: Behavior normal.         Assessment And Plan:     1. Benign essential hypertension  Chronic, well controlled. She will continue with current meds. She is encouraged to avoid adding salt to her foods. I will check her renal function today. She will rto in six months for a full physical examination.   Of note, I reviewed past medical history of the patient during the visit and there was some misinformation included. She reports she has had incorrect information in the past (another pt with same name). Everything was updated during this visit. She will let me know if she sees anything else in her Mychart that needs to be  corrected.   - BMP8+EGFR  2. Drug therapy   Maximino Greenland, MD    THE PATIENT IS ENCOURAGED TO PRACTICE SOCIAL DISTANCING DUE TO THE COVID-19 PANDEMIC.

## 2019-11-09 LAB — BMP8+EGFR
BUN/Creatinine Ratio: 17 (ref 9–23)
BUN: 13 mg/dL (ref 6–24)
CO2: 22 mmol/L (ref 20–29)
Calcium: 9.4 mg/dL (ref 8.7–10.2)
Chloride: 104 mmol/L (ref 96–106)
Creatinine, Ser: 0.75 mg/dL (ref 0.57–1.00)
GFR calc Af Amer: 113 mL/min/{1.73_m2} (ref >59)
GFR calc non Af Amer: 98 mL/min/{1.73_m2} (ref >59)
Glucose: 76 mg/dL (ref 65–99)
Potassium: 4.1 mmol/L (ref 3.5–5.2)
Sodium: 142 mmol/L (ref 134–144)

## 2019-11-27 ENCOUNTER — Inpatient Hospital Stay (HOSPITAL_COMMUNITY): Admit: 2019-11-27 | Payer: 59

## 2019-11-27 ENCOUNTER — Ambulatory Visit (INDEPENDENT_AMBULATORY_CARE_PROVIDER_SITE_OTHER): Payer: 59 | Admitting: Obstetrics

## 2019-11-27 ENCOUNTER — Other Ambulatory Visit: Payer: Self-pay

## 2019-11-27 ENCOUNTER — Encounter: Payer: Self-pay | Admitting: Obstetrics

## 2019-11-27 VITALS — BP 140/88 | HR 85 | Ht 62.0 in | Wt 120.0 lb

## 2019-11-27 DIAGNOSIS — Z124 Encounter for screening for malignant neoplasm of cervix: Secondary | ICD-10-CM

## 2019-11-27 DIAGNOSIS — B9689 Other specified bacterial agents as the cause of diseases classified elsewhere: Secondary | ICD-10-CM | POA: Diagnosis not present

## 2019-11-27 DIAGNOSIS — Z01419 Encounter for gynecological examination (general) (routine) without abnormal findings: Secondary | ICD-10-CM | POA: Diagnosis not present

## 2019-11-27 DIAGNOSIS — K640 First degree hemorrhoids: Secondary | ICD-10-CM

## 2019-11-27 DIAGNOSIS — N760 Acute vaginitis: Secondary | ICD-10-CM

## 2019-11-27 DIAGNOSIS — N898 Other specified noninflammatory disorders of vagina: Secondary | ICD-10-CM

## 2019-11-27 DIAGNOSIS — Z113 Encounter for screening for infections with a predominantly sexual mode of transmission: Secondary | ICD-10-CM

## 2019-11-27 DIAGNOSIS — F17200 Nicotine dependence, unspecified, uncomplicated: Secondary | ICD-10-CM

## 2019-11-27 DIAGNOSIS — Z1151 Encounter for screening for human papillomavirus (HPV): Secondary | ICD-10-CM | POA: Diagnosis not present

## 2019-11-27 DIAGNOSIS — N939 Abnormal uterine and vaginal bleeding, unspecified: Secondary | ICD-10-CM

## 2019-11-27 MED ORDER — PRENATAL PLUS 27-1 MG PO TABS: 1.0000 | ORAL_TABLET | Freq: Every day | ORAL | 11 refills | Status: DC

## 2019-11-27 MED ORDER — HYDROCORTISONE ACETATE 25 MG RE SUPP: 25.0000 mg | Freq: Two times a day (BID) | RECTAL | 11 refills | Status: DC

## 2019-11-27 NOTE — Progress Notes (Addendum)
Subjective:        Alyssa Norman is a 44 y.o. female here for annual  exam.  Current complaints:  Periods twice a month.  Personal health questionnaire:  Is patient Alyssa Norman, have a family history of breast and/or ovarian cancer: no Is there a family history of uterine cancer diagnosed at age < 40, gastrointestinal cancer, urinary tract cancer, family member who is a Field seismologist syndrome-associated carrier: no Is the patient overweight and hypertensive, family history of diabetes, personal history of gestational diabetes, preeclampsia or PCOS: no Is patient over 20, have PCOS,  family history of premature CHD under age 8, diabetes, smoke, have hypertension or peripheral artery disease:  no At any time, has a partner hit, kicked or otherwise hurt or frightened you?: no Over the past 2 weeks, have you felt down, depressed or hopeless?: no Over the past 2 weeks, have you felt little interest or pleasure in doing things?:no   Gynecologic History Patient's last menstrual period was 11/17/2019. Contraception: none Last Pap: unknown. Results were: normal Last mammogram: none. Results were: none  Obstetric History OB History  No obstetric history on file.    Past Medical History:  Diagnosis Date  . Diabetes mellitus ([place])   . Hypertension   . Smoker 02/01/2019  . Vitamin D deficiency 02/01/2019    Past Surgical History:  Procedure Laterality Date  . CESAREAN SECTION    . FINGER GANGLION CYST EXCISION       Current Outpatient Medications:  .  insulin aspart (NOVOLOG FLEXPEN) 100 UNIT/ML FlexPen, Inject 7 Units into the skin 3 (three) times daily with meals. And pen needles 4/day, Disp: 15 mL, Rfl: 11 .  Insulin Glargine (LANTUS SOLOSTAR) 100 UNIT/ML Solostar Pen, Inject 8 Units into the skin daily., Disp: 5 pen, Rfl: PRN .  Continuous Blood Gluc Sensor (FREESTYLE LIBRE 14 DAY SENSOR) MISC, 1 Device by Does not apply route every 14 (fourteen) days., Disp: 6 each, Rfl: 3 .   lisinopril (ZESTRIL) 10 MG tablet, Take 1 tablet by mouth once daily, Disp: 30 tablet, Rfl: 0 No Known Allergies  Social History   Tobacco Use  . Smoking status: Current Every Day Smoker    Packs/day: 0.50    Years: 29.00    Pack years: 14.50  . Smokeless tobacco: Never Used  Substance Use Topics  . Alcohol use: Yes    Comment: 3-4 times per week    Family History  Problem Relation Age of Onset  . Diabetes Mother       Review of Systems  Constitutional: negative for fatigue and weight loss Respiratory: negative for cough and wheezing Cardiovascular: negative for chest pain, fatigue and palpitations Gastrointestinal: positive for and hemorrhoids Musculoskeletal:negative for myalgias Neurological: negative for gait problems and tremors Behavioral/Psych: negative for abusive relationship, depression Endocrine: negative for temperature intolerance    Genitourinary:positive for abnormal menstrual periods.  Negative for genital lesions, hot flashes, sexual problems and vaginal discharge Integument/breast: negative for breast lump, breast tenderness, nipple discharge and skin lesion(s)    Objective:   BP 140/88   Pulse 85   Ht 5\' 2"  (1.575 m)   Wt 120 lb (54.4 kg)   LMP 11/17/2019   BMI 21.95 kg/m  General:   alert  Skin:   no rash or abnormalities  Lungs:   clear to auscultation bilaterally  Heart:   regular rate and rhythm, S1, S2 normal, no murmur, click, rub or gallop  Breasts:   normal without suspicious  masses, skin or nipple changes or axillary nodes  Abdomen:  normal findings: no organomegaly, soft, non-tender and no hernia  Pelvis:  External genitalia: normal general appearance Urinary system: urethral meatus normal and bladder without fullness, nontender Vaginal: normal without tenderness, induration or masses Cervix: stenosed.  IUD string not visible Adnexa: normal bimanual exam Uterus: anteverted and non-tender, normal size   Lab Review Urine pregnancy  test Labs reviewed yes Radiologic studies reviewed no  50% of 25 min visit spent on counseling and coordination of care.   Assessment:    .1. Encounter for routine gynecological examination with Papanicolaou smear of cervix Rx: - Cytology - PAP - prenatal vitamin w/FE, FA (PRENATAL 1 + 1) 27-1 MG TABS tablet; Take 1 tablet by mouth daily before breakfast.  Dispense: 30 tablet; Refill: 11  2. Abnormal uterine bleeding (AUB) Rx: - US PELVIC COMPLETE WITH TRANSVAGINAL; Future  3. Vaginal discharge Rx: - Cervicovaginal ancillary only  4. Tobacco dependence - cessation with medication and behavioral modification recommended    5. Grade I hemorrhoids Rx: - hydrocortisone (ANUSOL-HC) 25 MG suppository; Place 1 suppository (25 mg total) rectally 2 (two) times daily.  Dispense: 12 suppository; Refill: 11    Plan:    Education reviewed: calcium supplements, depression evaluation, low fat, low cholesterol diet, safe sex/STD prevention, self breast exams, smoking cessation and weight bearing exercise. Contraception: none. Mammogram ordered. Follow up in: 2 weeks.   No orders of the defined types were placed in this encounter.  Orders Placed This Encounter  Procedures  . US PELVIC COMPLETE WITH TRANSVAGINAL    Standing Status:   Future    Standing Expiration Date:   01/24/2021    Order Specific Question:   Reason for Exam (SYMPTOM  OR DIAGNOSIS REQUIRED)    Answer:   AUB    Order Specific Question:   Preferred imaging location?    Answer:   [facility] Outpatient Ultrasound    Brock Bad, MD 11/27/2019 4:03 PM

## 2019-11-28 ENCOUNTER — Telehealth: Payer: Self-pay

## 2019-11-28 ENCOUNTER — Telehealth: Payer: Self-pay | Admitting: Endocrinology

## 2019-11-28 ENCOUNTER — Other Ambulatory Visit: Payer: Self-pay

## 2019-11-28 DIAGNOSIS — I10 Essential (primary) hypertension: Secondary | ICD-10-CM

## 2019-11-28 DIAGNOSIS — E109 Type 1 diabetes mellitus without complications: Secondary | ICD-10-CM

## 2019-11-28 MED ORDER — LISINOPRIL 10 MG PO TABS: 10.0000 mg | ORAL_TABLET | Freq: Every day | ORAL | 0 refills | Status: DC

## 2019-11-28 MED ORDER — LANTUS SOLOSTAR 100 UNIT/ML ~~LOC~~ SOPN: 8.0000 [IU] | PEN_INJECTOR | Freq: Every day | SUBCUTANEOUS | 0 refills | Status: DC

## 2019-11-28 NOTE — Telephone Encounter (Signed)
Called pt and informed about need for an appt before refilling Rx. Pt became upset providing the following statements throughout our conversation; "this is ridiculous", "guess I'm doin' without my insulin", "I never agreed to see Dr. Everardo All", "Suezanne Jacquet forced me to see Dr. Everardo All", "he doesn't tell me nothin' I don't already know". Advised pt of importance in keeping appointment to ensure efficacy and safety of medications being prescribed. Pt originally declined my offer to schedule an appt stating, "I'll do without until 3/4 and allow my PCP to take care of it." Reassured pt that we are not refusing a Rx but that an appt is required before a refill could be sent. Pt then stated, "well then when can you get me in?" Provided availabilities for 2/17, 2/18 and 2/19, all of which pt declined stating, "I work 6 days a week so nothin' is going to work." Then pt stated, "don't you got nothin' for 3/4?". Provided with only availability of 8:30am on 3/4. Pt became irritated, stating "ain't you got nothin' later". Advised there were no availabilities for later in the day on 3/4. Pt then states, "put me down", then immediately disconnected. Per Dr. George Hugh orders, refill was sent. Routing to Dr. Everardo All to make him aware of pt behavior.

## 2019-11-28 NOTE — Telephone Encounter (Signed)
Pt was notified to get lantus through Endocrinology. But she is out of her bp and I advised she was overdue for cpe so she scheduled for march 4th and I have refilled

## 2019-11-28 NOTE — Telephone Encounter (Signed)
MEDICATION: Lantus  PHARMACY:  [facility] on [address]  IS THIS A 90 DAY SUPPLY :   IS PATIENT OUT OF MEDICATION: yes  IF NOT; HOW MUCH IS LEFT:   LAST APPOINTMENT DATE: @Visit  date not found  NEXT APPOINTMENT DATE:@Visit  date not found  DO WE HAVE YOUR PERMISSION TO LEAVE A DETAILED MESSAGE: YES [phone]  OTHER COMMENTS:    **Let patient know to contact pharmacy at the end of the day to make sure medication is ready. **  ** Please notify patient to allow 48-72 hours to process**  **Encourage patient to contact the pharmacy for refills or they can request refills through [facility]**

## 2019-11-28 NOTE — Telephone Encounter (Signed)
Please advise 

## 2019-11-28 NOTE — Telephone Encounter (Signed)
Received fax from [facility] stating the pt. Needs a refill on her Lantus Solostar pt. Last apt. Was 02/01/19.

## 2019-11-28 NOTE — Telephone Encounter (Signed)
If pt is at work, VV is OK.

## 2019-11-28 NOTE — Telephone Encounter (Signed)
1.  Please schedule f/u appt 2.  Then please refill x 1, pending that appt.  

## 2019-11-29 NOTE — Telephone Encounter (Signed)
Pt declined Vv's, stating she requested 3/4 off.

## 2019-11-30 ENCOUNTER — Other Ambulatory Visit: Payer: 59

## 2019-11-30 LAB — CERVICOVAGINAL ANCILLARY ONLY
Bacterial Vaginitis (gardnerella): POSITIVE — AB
Candida Glabrata: NEGATIVE
Candida Vaginitis: NEGATIVE
Chlamydia: NEGATIVE
Comment: NEGATIVE
Comment: NEGATIVE
Comment: NEGATIVE
Comment: NEGATIVE
Comment: NEGATIVE
Comment: NORMAL
Neisseria Gonorrhea: NEGATIVE
Trichomonas: NEGATIVE

## 2019-12-01 ENCOUNTER — Other Ambulatory Visit: Payer: Self-pay | Admitting: Obstetrics

## 2019-12-01 DIAGNOSIS — N760 Acute vaginitis: Secondary | ICD-10-CM

## 2019-12-01 DIAGNOSIS — B9689 Other specified bacterial agents as the cause of diseases classified elsewhere: Secondary | ICD-10-CM

## 2019-12-01 MED ORDER — TINIDAZOLE 500 MG PO TABS: 1000.0000 mg | ORAL_TABLET | Freq: Every day | ORAL | 2 refills | Status: DC

## 2019-12-04 LAB — CYTOLOGY - PAP
Comment: NEGATIVE
Diagnosis: NEGATIVE
High risk HPV: NEGATIVE

## 2019-12-11 ENCOUNTER — Telehealth: Payer: 59 | Admitting: Obstetrics

## 2019-12-14 ENCOUNTER — Encounter: Payer: 59 | Admitting: Family Medicine

## 2019-12-14 ENCOUNTER — Ambulatory Visit: Payer: 59 | Admitting: Endocrinology

## 2019-12-18 ENCOUNTER — Encounter: Payer: Self-pay | Admitting: Family Medicine

## 2019-12-21 ENCOUNTER — Other Ambulatory Visit: Payer: Self-pay

## 2019-12-25 ENCOUNTER — Encounter: Payer: Self-pay | Admitting: Endocrinology

## 2019-12-25 ENCOUNTER — Ambulatory Visit (INDEPENDENT_AMBULATORY_CARE_PROVIDER_SITE_OTHER): Payer: 59 | Admitting: Endocrinology

## 2019-12-25 ENCOUNTER — Other Ambulatory Visit: Payer: Self-pay

## 2019-12-25 VITALS — BP 122/80 | HR 87 | Ht 62.0 in | Wt 126.4 lb

## 2019-12-25 DIAGNOSIS — E109 Type 1 diabetes mellitus without complications: Secondary | ICD-10-CM | POA: Diagnosis not present

## 2019-12-25 LAB — POCT GLYCOSYLATED HEMOGLOBIN (HGB A1C): Hemoglobin A1C: 7.4 % — AB (ref 4.0–5.6)

## 2019-12-25 MED ORDER — NOVOLOG FLEXPEN 100 UNIT/ML ~~LOC~~ SOPN: 4.0000 [IU] | PEN_INJECTOR | Freq: Three times a day (TID) | SUBCUTANEOUS | 5 refills | Status: DC

## 2019-12-25 MED ORDER — LANTUS SOLOSTAR 100 UNIT/ML ~~LOC~~ SOPN: 8.0000 [IU] | PEN_INJECTOR | Freq: Every day | SUBCUTANEOUS | 5 refills | Status: DC

## 2019-12-25 NOTE — Progress Notes (Signed)
Subjective:    Patient ID: Alyssa Norman, female    DOB: 1976-08-04, 44 y.o.   MRN: [id number]  HPI Pt returns for f/u of diabetes mellitus: DM type: 1 Dx'ed: 7829 Complications: none Therapy: insulin since dx GDM: never DKA: once (at dx) Severe hypoglycemia: last episode was 2017 Pancreatitis: never Pancreatic imaging: never SDOH: She works 2nd shift--[place]. Other: she takes multiple daily injections; she declines pump rx; She says she is not at risk for another pregnancy. Interval history: She is overdue for f/u.  pt states she feels well in general.  Pt says she never misses the insulin.  She has mild hypoglycemia approx QOD.  This happens in the middle of the night.  It is in general highest in the afternoon.  She takes novolog 4-5 units 3 times a day (just before each meal), and Lantus 8-10 units QHS.  She works 2nd shift.   Past Medical History:  Diagnosis Date  . Diabetes mellitus ([place])   . Hypertension   . Smoker 02/01/2019  . Vitamin D deficiency 02/01/2019    Past Surgical History:  Procedure Laterality Date  . CESAREAN SECTION    . FINGER GANGLION CYST EXCISION      Social History   Socioeconomic History  . Marital status: Single    Spouse name: Not on file  . Number of children: Not on file  . Years of education: Not on file  . Highest education level: Not on file  Occupational History  . Not on file  Tobacco Use  . Smoking status: Current Every Day Smoker    Packs/day: 0.50    Years: 29.00    Pack years: 14.50  . Smokeless tobacco: Never Used  Substance and Sexual Activity  . Alcohol use: Yes    Comment: 3-4 times per week  . Drug use: Never  . Sexual activity: Yes  Other Topics Concern  . Not on file  Social History Narrative  . Not on file   Social Determinants of Health   Financial Resource Strain:   . Difficulty of Paying Living Expenses:   Food Insecurity:   . Worried About Charity fundraiser in the Last Year:   . Academic librarian in the Last Year:   Transportation Needs:   . Film/video editor (Medical):   Marland Kitchen Lack of Transportation (Non-Medical):   Physical Activity:   . Days of Exercise per Week:   . Minutes of Exercise per Session:   Stress:   . Feeling of Stress :   Social Connections:   . Frequency of Communication with Friends and Family:   . Frequency of Social Gatherings with Friends and Family:   . Attends Religious Services:   . Active Member of Clubs or Organizations:   . Attends Archivist Meetings:   Marland Kitchen Marital Status:   Intimate Partner Violence:   . Fear of Current or Ex-Partner:   . Emotionally Abused:   Marland Kitchen Physically Abused:   . Sexually Abused:     Current Outpatient Medications on File Prior to Visit  Medication Sig Dispense Refill  . hydrocortisone (ANUSOL-HC) 25 MG suppository Place 1 suppository (25 mg total) rectally 2 (two) times daily. 12 suppository 11  . lisinopril (ZESTRIL) 10 MG tablet Take 1 tablet (10 mg total) by mouth daily. 30 tablet 0  . prenatal vitamin w/FE, FA (PRENATAL 1 + 1) 27-1 MG TABS tablet Take 1 tablet by mouth daily before breakfast. 30 tablet  11  . tinidazole (TINDAMAX) 500 MG tablet Take 2 tablets (1,000 mg total) by mouth daily with breakfast. 10 tablet 2  . Continuous Blood Gluc Sensor (FREESTYLE LIBRE 14 DAY SENSOR) MISC 1 Device by Does not apply route every 14 (fourteen) days. (Patient not taking: Reported on 12/25/2019) 6 each 3   No current facility-administered medications on file prior to visit.    No Known Allergies  Family History  Problem Relation Age of Onset  . Diabetes Mother     BP 122/80   Pulse 87   Ht 5\' 2"  (1.575 m)   Wt 126 lb 6.4 oz (57.3 kg)   SpO2 98%   BMI 23.12 kg/m    Review of Systems Denies LOC.      Objective:   Physical Exam VITAL SIGNS:  See vs page GENERAL: no distress Pulses: dorsalis pedis intact bilat.   MSK: no deformity of the feet CV: no leg edema Skin:  no ulcer on the feet.   normal color and temp on the feet. Neuro: sensation is intact to touch on the feet  Lab Results  Component Value Date   HGBA1C 7.4 (A) 12/25/2019       Assessment & Plan:  Type 1 DM: She would benefit from increased rx, if it can be done with a regimen that avoids or minimizes hypoglycemia. Hypoglycemia: this limits aggressiveness of glycemic control Noncompliance with f/u: This limits rx of DM   Patient Instructions  Please reduce the Lantus to 8 units at bedtime, and: Please continue the same Novolog.  check your blood sugar 4 times a day: before the 3 meals, and at bedtime.  also check if you have symptoms of your blood sugar being too high or too low.  please keep a record of the readings and bring it to your next appointment here (or you can bring the meter itself).  You can write it on any piece of paper.  please call 12/27/2019 sooner if your blood sugar goes below 70, or if you have a lot of readings over 200.   Please come back for a follow-up appointment in 2 months.

## 2019-12-25 NOTE — Patient Instructions (Addendum)
Please reduce the Lantus to 8 units at bedtime, and: Please continue the same Novolog.  check your blood sugar 4 times a day: before the 3 meals, and at bedtime.  also check if you have symptoms of your blood sugar being too high or too low.  please keep a record of the readings and bring it to your next appointment here (or you can bring the meter itself).  You can write it on any piece of paper.  please call us sooner if your blood sugar goes below 70, or if you have a lot of readings over 200.   Please come back for a follow-up appointment in 2 months.

## 2019-12-29 ENCOUNTER — Other Ambulatory Visit: Payer: Self-pay

## 2019-12-29 DIAGNOSIS — E109 Type 1 diabetes mellitus without complications: Secondary | ICD-10-CM

## 2019-12-29 MED ORDER — INSULIN LISPRO (1 UNIT DIAL) 100 UNIT/ML (KWIKPEN): PEN_INJECTOR | SUBCUTANEOUS | 0 refills | Status: DC

## 2020-01-07 ENCOUNTER — Other Ambulatory Visit: Payer: Self-pay | Admitting: Family Medicine

## 2020-01-07 DIAGNOSIS — I10 Essential (primary) hypertension: Secondary | ICD-10-CM

## 2020-01-08 NOTE — Telephone Encounter (Signed)
Had an appt in 4/16

## 2020-01-17 DIAGNOSIS — Z124 Encounter for screening for malignant neoplasm of cervix: Secondary | ICD-10-CM | POA: Diagnosis not present

## 2020-01-17 DIAGNOSIS — Z0000 Encounter for general adult medical examination without abnormal findings: Secondary | ICD-10-CM | POA: Diagnosis not present

## 2020-01-17 DIAGNOSIS — N926 Irregular menstruation, unspecified: Secondary | ICD-10-CM | POA: Diagnosis not present

## 2020-01-26 ENCOUNTER — Encounter: Payer: 59 | Admitting: Family Medicine

## 2020-02-19 ENCOUNTER — Telehealth: Payer: Self-pay

## 2020-02-19 ENCOUNTER — Telehealth: Payer: Self-pay | Admitting: Family Medicine

## 2020-02-19 ENCOUNTER — Other Ambulatory Visit: Payer: Self-pay | Admitting: Family Medicine

## 2020-02-19 DIAGNOSIS — I10 Essential (primary) hypertension: Secondary | ICD-10-CM

## 2020-02-19 NOTE — Telephone Encounter (Signed)
Per pt, pt had enough until appt.

## 2020-02-19 NOTE — Telephone Encounter (Signed)
Pt has an appt next week and has enough to last

## 2020-02-19 NOTE — Telephone Encounter (Signed)
Pt called and is wanting to know if you will take over seeing her for her diabetes states she talked to her dr at the diabetes and he is ok with this, I told her I would have to ask you about his pt can be reached at [phone] and informed pt that you was out of the office today and tomorrow

## 2020-02-19 NOTE — Telephone Encounter (Signed)
I received a fax from [facility] pharmacy for a refill on her Lisinopril pt. Last apt was 02/01/19 and has an upcoming apt on 02/28/20.

## 2020-02-20 NOTE — Telephone Encounter (Signed)
I am not able to take over her diabetes care since she has type 1 diabetes. She will need to continue seeing her endocrinologist.

## 2020-02-20 NOTE — Telephone Encounter (Signed)
Called pt and informed her and states that he does noting for her and she is not having any numbness or anything, and is a waste and she has to pay $50 each time to go, states he tells her the same thing that the dr in [place] tells her I informed her that I would send it back to you again

## 2020-02-20 NOTE — Telephone Encounter (Signed)
I do not treat type 1 diabetes. I am happy to be her PCP but she will need to still see an endocrinologist for her diabetes.

## 2020-02-21 NOTE — Telephone Encounter (Signed)
Left detailed message on pt's vm

## 2020-02-21 NOTE — Telephone Encounter (Signed)
Forwarding to you as pt did not understand when I explained this to her

## 2020-02-28 ENCOUNTER — Encounter: Payer: 59 | Admitting: Family Medicine

## 2020-03-04 ENCOUNTER — Encounter: Payer: 59 | Admitting: Family Medicine

## 2020-03-16 ENCOUNTER — Other Ambulatory Visit: Payer: Self-pay | Admitting: Family Medicine

## 2020-03-16 DIAGNOSIS — I10 Essential (primary) hypertension: Secondary | ICD-10-CM

## 2020-03-18 NOTE — Telephone Encounter (Signed)
Pt has an appt on Wednesday morning and does not need a refill until then

## 2020-03-20 ENCOUNTER — Encounter: Payer: Self-pay | Admitting: Family Medicine

## 2020-03-20 ENCOUNTER — Ambulatory Visit: Payer: 59 | Admitting: Family Medicine

## 2020-03-20 ENCOUNTER — Other Ambulatory Visit: Payer: Self-pay

## 2020-03-20 VITALS — BP 120/70 | HR 91 | Wt 121.4 lb

## 2020-03-20 DIAGNOSIS — E559 Vitamin D deficiency, unspecified: Secondary | ICD-10-CM | POA: Diagnosis not present

## 2020-03-20 DIAGNOSIS — I10 Essential (primary) hypertension: Secondary | ICD-10-CM | POA: Diagnosis not present

## 2020-03-20 DIAGNOSIS — F17200 Nicotine dependence, unspecified, uncomplicated: Secondary | ICD-10-CM

## 2020-03-20 DIAGNOSIS — R5383 Other fatigue: Secondary | ICD-10-CM

## 2020-03-20 DIAGNOSIS — E109 Type 1 diabetes mellitus without complications: Secondary | ICD-10-CM | POA: Diagnosis not present

## 2020-03-20 DIAGNOSIS — Z13220 Encounter for screening for lipoid disorders: Secondary | ICD-10-CM

## 2020-03-20 MED ORDER — LISINOPRIL 10 MG PO TABS: 10.0000 mg | ORAL_TABLET | Freq: Every day | ORAL | 1 refills | Status: DC

## 2020-03-20 NOTE — Patient Instructions (Signed)
Your BP is in goal range today.   I recommend that you stop smoking for your overall health and to prevent heart disease.    Steps to Quit Smoking Smoking tobacco is the leading cause of preventable death. It can affect almost every organ in the body. Smoking puts you and people around you at risk for many serious, long-lasting (chronic) diseases. Quitting smoking can be hard, but it is one of the best things that you can do for your health. It is never too late to quit. How do I get ready to quit? When you decide to quit smoking, make a plan to help you succeed. Before you quit:  Pick a date to quit. Set a date within the next 2 weeks to give you time to prepare.  Write down the reasons why you are quitting. Keep this list in places where you will see it often.  Tell your family, friends, and co-workers that you are quitting. Their support is important.  Talk with your doctor about the choices that may help you quit.  Find out if your health insurance will pay for these treatments.  Know the people, places, things, and activities that make you want to smoke (triggers). Avoid them. What first steps can I take to quit smoking?  Throw away all cigarettes at home, at work, and in your car.  Throw away the things that you use when you smoke, such as ashtrays and lighters.  Clean your car. Make sure to empty the ashtray.  Clean your home, including curtains and carpets. What can I do to help me quit smoking? Talk with your doctor about taking medicines and seeing a counselor at the same time. You are more likely to succeed when you do both.  If you are pregnant or breastfeeding, talk with your doctor about counseling or other ways to quit smoking. Do not take medicine to help you quit smoking unless your doctor tells you to do so. To quit smoking: Quit right away  Quit smoking totally, instead of slowly cutting back on how much you smoke over a period of time.  Go to counseling. You  are more likely to quit if you go to counseling sessions regularly. Take medicine You may take medicines to help you quit. Some medicines need a prescription, and some you can buy over-the-counter. Some medicines may contain a drug called nicotine to replace the nicotine in cigarettes. Medicines may:  Help you to stop having the desire to smoke (cravings).  Help to stop the problems that come when you stop smoking (withdrawal symptoms). Your doctor may ask you to use:  Nicotine patches, gum, or lozenges.  Nicotine inhalers or sprays.  Non-nicotine medicine that is taken by mouth. Find resources Find resources and other ways to help you quit smoking and remain smoke-free after you quit. These resources are most helpful when you use them often. They include:  Online chats with a Social worker.  Phone quitlines.  Printed Furniture conservator/restorer.  Support groups or group counseling.  Text messaging programs.  Mobile phone apps. Use apps on your mobile phone or tablet that can help you stick to your quit plan. There are many free apps for mobile phones and tablets as well as websites. Examples include Quit Guide from the [employer] and smokefree.gov  What things can I do to make it easier to quit?   Talk to your family and friends. Ask them to support and encourage you.  Call a phone quitline (1-800-QUIT-NOW), reach out  to support groups, or work with a Veterinary surgeon.  Ask people who smoke to not smoke around you.  Avoid places that make you want to smoke, such as: ? Bars. ? Parties. ? Smoke-break areas at work.  Spend time with people who do not smoke.  Lower the stress in your life. Stress can make you want to smoke. Try these things to help your stress: ? Getting regular exercise. ? Doing deep-breathing exercises. ? Doing yoga. ? Meditating. ? Doing a body scan. To do this, close your eyes, focus on one area of your body at a time from head to toe. Notice which parts of your body are  tense. Try to relax the muscles in those areas. How will I feel when I quit smoking? Day 1 to 3 weeks Within the first 24 hours, you may start to have some problems that come from quitting tobacco. These problems are very bad 2-3 days after you quit, but they do not often last for more than 2-3 weeks. You may get these symptoms:  Mood swings.  Feeling restless, nervous, angry, or annoyed.  Trouble concentrating.  Dizziness.  Strong desire for high-sugar foods and nicotine.  Weight gain.  Trouble pooping (constipation).  Feeling like you may vomit (nausea).  Coughing or a sore throat.  Changes in how the medicines that you take for other issues work in your body.  Depression.  Trouble sleeping (insomnia). Week 3 and afterward After the first 2-3 weeks of quitting, you may start to notice more positive results, such as:  Better sense of smell and taste.  Less coughing and sore throat.  Slower heart rate.  Lower blood pressure.  Clearer skin.  Better breathing.  Fewer sick days. Quitting smoking can be hard. Do not give up if you fail the first time. Some people need to try a few times before they succeed. Do your best to stick to your quit plan, and talk with your doctor if you have any questions or concerns. Summary  Smoking tobacco is the leading cause of preventable death. Quitting smoking can be hard, but it is one of the best things that you can do for your health.  When you decide to quit smoking, make a plan to help you succeed.  Quit smoking right away, not slowly over a period of time.  When you start quitting, seek help from your doctor, family, or friends. This information is not intended to replace advice given to you by your health care provider. Make sure you discuss any questions you have with your health care provider. Document Revised: 06/23/2019 Document Reviewed: 12/17/2018 Elsevier Patient Education  2020 [employer].

## 2020-03-20 NOTE — Progress Notes (Signed)
Subjective:    Patient ID: Alyssa Norman, female    DOB: 23-Jul-1976, 44 y.o.   MRN: [id number]  HPI Chief Complaint  Patient presents with  . med check    med check on bp   She is here for a follow up on HTN. Reports taking lisinopril 10 mg. Checks her BP at work and readings are at times elevated above goal.   Reports feeling tired often. She thinks it may be due to working nights. She is visibly tired this morning and states she is exhausted. She worked last night.  She is fasting.  Denies fever, chills, dizziness, chest pain, palpitations, shortness of breath, abdominal pain, N/V/D, urinary symptoms, LE edema.   Vitamin D def- she is taking vitamins including vitamin D. Has not had her vitamin D level checked in years.   Type 1 diabetes is managed by Dr. Everardo All. Her last HgbA1c was 7.4% on 12/25/2019. She is on an insulin regimen including Novolog and Lantus.   She is still smoking and no plans to stop currently.   Reviewed allergies, medications, past medical, surgical, family, and social history.    Review of Systems Pertinent positives and negatives in the history of present illness.     Objective:   Physical Exam BP 120/70   Pulse 91   Wt 121 lb 6.4 oz (55.1 kg)   BMI 22.20 kg/m   Alert and in no distress.  Neck is supple without adenopathy or thyromegaly. Cardiac exam shows a regular sinus rhythm without murmurs or gallops. Lungs are clear to auscultation.  Extremities without edema.  Skin is warm and dry.       Assessment & Plan:  Essential hypertension - Plan: lisinopril (ZESTRIL) 10 MG tablet, CBC with Differential/Platelet, Comprehensive metabolic panel -She is here today for follow-up on hypertension.  Reports taking lisinopril daily without any side effects.  Her blood pressure is in goal range today.  Encouraged her to keep an eye on it outside of here.  Low-sodium diet encouraged.  Check labs in follow-up  Vitamin D deficiency - Plan: VITAMIN D 25  Hydroxy (Vit-D Deficiency, Fractures) -She is currently taking a vitamin D supplement but is not sure of the dose.  Will check vitamin D level and follow-up  Smoker -She is not currently ready to stop smoking.  States she is aware that she should.  Encouraged her to do so and that this would help her overall health.  Discussed that it would also help reduce her risk of heart disease down the road.  Type 1 diabetes mellitus without complications (HCC) - Plan: CBC with Differential/Platelet, Comprehensive metabolic panel, TSH, T4, free, T3, Lipid panel -She question whether I would take over her diabetes so that she did not have to go to endocrinology due to the more expensive co-pay.  Discussed that I am not comfortable taking care of insulin-dependent type 1 diabetes and I recommend that she continue seeing her endocrinologist.  She is agreeable. She is on an ACE inhibitor but not on a statin.  Discussed we will need to check her lipids and that per diabetes guidelines, she should be on a statin to reduce her risk of heart disease.  Counseling was done on multiple risk factors.  Fatigue, unspecified type - Plan: CBC with Differential/Platelet, Comprehensive metabolic panel, TSH, T4, free, T3, VITAMIN D 25 Hydroxy (Vit-D Deficiency, Fractures), Vitamin B12  Screening for lipid disorders - Plan: Lipid panel -She is not on a statin but we  need to consider starting 1 due to her diabetes.  Check lipids to see current lipid levels.

## 2020-03-21 ENCOUNTER — Other Ambulatory Visit: Payer: Self-pay | Admitting: Family Medicine

## 2020-03-21 DIAGNOSIS — E559 Vitamin D deficiency, unspecified: Secondary | ICD-10-CM

## 2020-03-21 LAB — CBC WITH DIFFERENTIAL/PLATELET
Basophils Absolute: 0 10*3/uL (ref 0.0–0.2)
Basos: 1 %
EOS (ABSOLUTE): 0.1 10*3/uL (ref 0.0–0.4)
Eos: 2 %
Hematocrit: 41.2 % (ref 34.0–46.6)
Hemoglobin: 14 g/dL (ref 11.1–15.9)
Immature Grans (Abs): 0 10*3/uL (ref 0.0–0.1)
Immature Granulocytes: 0 %
Lymphocytes Absolute: 3 10*3/uL (ref 0.7–3.1)
Lymphs: 50 %
MCH: 33.5 pg — ABNORMAL HIGH (ref 26.6–33.0)
MCHC: 34 g/dL (ref 31.5–35.7)
MCV: 99 fL — ABNORMAL HIGH (ref 79–97)
Monocytes Absolute: 0.4 10*3/uL (ref 0.1–0.9)
Monocytes: 6 %
Neutrophils Absolute: 2.4 10*3/uL (ref 1.4–7.0)
Neutrophils: 41 %
Platelets: 267 10*3/uL (ref 150–450)
RBC: 4.18 x10E6/uL (ref 3.77–5.28)
RDW: 13.1 % (ref 11.7–15.4)
WBC: 5.9 10*3/uL (ref 3.4–10.8)

## 2020-03-21 LAB — LIPID PANEL
Chol/HDL Ratio: 1.7 ratio (ref 0.0–4.4)
Cholesterol, Total: 241 mg/dL — ABNORMAL HIGH (ref 100–199)
HDL: 139 mg/dL (ref >39)
LDL Chol Calc (NIH): 77 mg/dL (ref 0–99)
Triglycerides: 156 mg/dL — ABNORMAL HIGH (ref 0–149)
VLDL Cholesterol Cal: 25 mg/dL (ref 5–40)

## 2020-03-21 LAB — VITAMIN D 25 HYDROXY (VIT D DEFICIENCY, FRACTURES): Vit D, 25-Hydroxy: 10.8 ng/mL — ABNORMAL LOW (ref 30.0–100.0)

## 2020-03-21 LAB — COMPREHENSIVE METABOLIC PANEL
ALT: 10 IU/L (ref 0–32)
AST: 14 IU/L (ref 0–40)
Albumin/Globulin Ratio: 1.9 (ref 1.2–2.2)
Albumin: 4.5 g/dL (ref 3.8–4.8)
Alkaline Phosphatase: 63 IU/L (ref 48–121)
BUN/Creatinine Ratio: 11 (ref 9–23)
BUN: 10 mg/dL (ref 6–24)
Bilirubin Total: <0.2 mg/dL (ref 0.0–1.2)
CO2: 17 mmol/L — ABNORMAL LOW (ref 20–29)
Calcium: 8.8 mg/dL (ref 8.7–10.2)
Chloride: 107 mmol/L — ABNORMAL HIGH (ref 96–106)
Creatinine, Ser: 0.89 mg/dL (ref 0.57–1.00)
GFR calc Af Amer: 92 mL/min/{1.73_m2} (ref >59)
GFR calc non Af Amer: 80 mL/min/{1.73_m2} (ref >59)
Globulin, Total: 2.4 g/dL (ref 1.5–4.5)
Glucose: 81 mg/dL (ref 65–99)
Potassium: 4.1 mmol/L (ref 3.5–5.2)
Sodium: 141 mmol/L (ref 134–144)
Total Protein: 6.9 g/dL (ref 6.0–8.5)

## 2020-03-21 LAB — T3: T3, Total: 82 ng/dL (ref 71–180)

## 2020-03-21 LAB — T4, FREE: Free T4: 1.01 ng/dL (ref 0.82–1.77)

## 2020-03-21 LAB — TSH: TSH: 1.61 u[IU]/mL (ref 0.450–4.500)

## 2020-03-21 LAB — VITAMIN B12: Vitamin B-12: 730 pg/mL (ref 232–1245)

## 2020-03-21 MED ORDER — VITAMIN D (ERGOCALCIFEROL) 1.25 MG (50000 UNIT) PO CAPS: 50000.0000 [IU] | ORAL_CAPSULE | ORAL | 0 refills | Status: DC

## 2020-03-29 ENCOUNTER — Other Ambulatory Visit: Payer: Self-pay

## 2020-03-29 ENCOUNTER — Telehealth: Payer: Self-pay

## 2020-03-29 DIAGNOSIS — E559 Vitamin D deficiency, unspecified: Secondary | ICD-10-CM

## 2020-03-29 DIAGNOSIS — I10 Essential (primary) hypertension: Secondary | ICD-10-CM

## 2020-03-29 MED ORDER — LISINOPRIL 10 MG PO TABS: 10.0000 mg | ORAL_TABLET | Freq: Every day | ORAL | 1 refills | Status: DC

## 2020-03-29 MED ORDER — VITAMIN D (ERGOCALCIFEROL) 1.25 MG (50000 UNIT) PO CAPS: 50000.0000 [IU] | ORAL_CAPSULE | ORAL | 0 refills | Status: DC

## 2020-03-29 NOTE — Telephone Encounter (Signed)
Please assist her with this. Thanks.

## 2020-03-29 NOTE — Telephone Encounter (Signed)
Done KH 

## 2020-03-29 NOTE — Telephone Encounter (Signed)
Pt. Called back about her lab results which I made her aware of, I let her know her Vit D was low and you sent in medication for that. She let me know that she had to switch pharmacy's now to the [facility] on [address]. So if you could resend the prescription for her Lisinopril and Vit D.

## 2020-04-17 ENCOUNTER — Ambulatory Visit
Admission: EM | Admit: 2020-04-17 | Discharge: 2020-04-17 | Disposition: A | Discharge status: Home / Self Care | Payer: BC Managed Care – PPO | Attending: Emergency Medicine | Admitting: Emergency Medicine

## 2020-04-17 DIAGNOSIS — L02214 Cutaneous abscess of groin: Secondary | ICD-10-CM | POA: Diagnosis not present

## 2020-04-17 MED ORDER — SULFAMETHOXAZOLE-TRIMETHOPRIM 800-160 MG PO TABS: 1.0000 | ORAL_TABLET | Freq: Two times a day (BID) | ORAL | 0 refills | Status: AC

## 2020-04-17 MED ORDER — FLUCONAZOLE 150 MG PO TABS: 150.0000 mg | ORAL_TABLET | Freq: Every day | ORAL | 0 refills | Status: DC

## 2020-04-17 NOTE — Discharge Instructions (Addendum)
Take the antibiotic as directed.    Keep your wound clean and dry.  Wash it gently twice a day with soap and water.  Apply the antibiotic cream twice a day.    Return here if you see signs of infection, such as increased pain, redness, warmth, fever, chills, or other concerning symptoms.

## 2020-04-17 NOTE — ED Provider Notes (Signed)
Renaldo Fiddler    CSN: [id number] Arrival date & time: 04/17/20  4627      History   Chief Complaint Chief Complaint  Patient presents with  . Abscess    HPI Cheryl Hill is a 44 y.o. female.   Patient presents with abscess in her left groin x1.5 weeks.  She states the area is painful, red, swollen.  She has a history of abscesses in various areas, including her axilla, buttocks, groin.  She has attempted treatment with warm compresses.  She states it started draining pus while here in office.  She denies fever, chills, body aches, cough, SOB, abdominal pain, dysuria, or other symptoms.    The history is provided by the patient.    Past Medical History:  Diagnosis Date  . Atopic dermatitis 11/10/2016  . Hypertension     Patient Active Problem List   Diagnosis Date Noted  . STD exposure 12/08/2016  . Essential hypertension, benign 08/10/2014  . Neuropathy of leg 08/10/2014    No past surgical history on file.  OB History   No obstetric history on file.      Home Medications    Prior to Admission medications   Medication Sig Start Date End Date Taking? Authorizing Provider  etonogestrel (NEXPLANON) 68 MG IMPL implant Inject into the skin.    [provider]  fluconazole (DIFLUCAN) 150 MG tablet Take 1 tablet (150 mg total) by mouth daily. Take one tablet today.  May repeat in 3 days. 04/17/20   Mickie Bail, NP  losartan (COZAAR) 50 MG tablet Take 1 tablet (50 mg total) by mouth daily. 11/08/19   Dorothyann Peng, MD  sulfamethoxazole-trimethoprim (BACTRIM DS) 800-160 MG tablet Take 1 tablet by mouth 2 (two) times daily for 7 days. 04/17/20 04/24/20  Mickie Bail, NP    Family History Family History  Problem Relation Age of Onset  . Diabetes Mother   . Hypertension Mother   . Hyperlipidemia Mother   . Cataracts Mother   . Obesity Mother   . Hyperlipidemia Father   . Hypertension Father   . Prostate cancer Father   . Diabetes Father      Social History Social History   Tobacco Use  . Smoking status: Never Smoker  . Smokeless tobacco: Never Used  Vaping Use  . Vaping Use: Never used  Substance Use Topics  . Alcohol use: Never  . Drug use: No     Allergies   Patient has no known allergies.   Review of Systems Review of Systems  Constitutional: Negative for chills and fever.  HENT: Negative for ear pain and sore throat.   Eyes: Negative for pain and visual disturbance.  Respiratory: Negative for cough and shortness of breath.   Cardiovascular: Negative for chest pain and palpitations.  Gastrointestinal: Negative for abdominal pain and vomiting.  Genitourinary: Negative for dysuria and hematuria.  Musculoskeletal: Negative for arthralgias and back pain.  Skin: Positive for wound. Negative for color change and rash.  Neurological: Negative for seizures and syncope.  All other systems reviewed and are negative.    Physical Exam Triage Vital Signs ED Triage Vitals  Enc Vitals Group     BP 04/17/20 0846 (!) 135/94     Pulse Rate 04/17/20 0846 73     Resp 04/17/20 0846 16     Temp 04/17/20 0846 98.8 F (37.1 C)     Temp src --      SpO2 04/17/20 0846 98 %  Weight --      Height --      Head Circumference --      Peak Flow --      Pain Score 04/17/20 0843 3     Pain Loc --      Pain Edu? --      Excl. in GC? --    No data found.  Updated Vital Signs BP (!) 135/94   Pulse 73   Temp 98.8 F (37.1 C)   Resp 16   SpO2 98%   Visual Acuity Right Eye Distance:   Left Eye Distance:   Bilateral Distance:    Right Eye Near:   Left Eye Near:    Bilateral Near:     Physical Exam Vitals and nursing note reviewed.  Constitutional:      General: She is not in acute distress.    Appearance: She is well-developed. She is not ill-appearing.  HENT:     Head: Normocephalic and atraumatic.     Mouth/Throat:     Mouth: Mucous membranes are moist.     Pharynx: Oropharynx is clear.  Eyes:      Conjunctiva/sclera: Conjunctivae normal.  Cardiovascular:     Rate and Rhythm: Normal rate and regular rhythm.     Heart sounds: No murmur heard.   Pulmonary:     Effort: Pulmonary effort is normal. No respiratory distress.     Breath sounds: Normal breath sounds.  Abdominal:     Palpations: Abdomen is soft.     Tenderness: There is no abdominal tenderness. There is no guarding or rebound.  Musculoskeletal:     Cervical back: Neck supple.  Skin:    General: Skin is warm and dry.     Findings: Lesion present.     Comments: 2 cm firm abscess in left groin; tender to palpation; purulent drainage.    Neurological:     General: No focal deficit present.     Mental Status: She is alert and oriented to person, place, and time.     Gait: Gait normal.  Psychiatric:        Mood and Affect: Mood normal.        Behavior: Behavior normal.      UC Treatments / Results  Labs (all labs ordered are listed, but only abnormal results are displayed) Labs Reviewed - No data to display  EKG   Radiology No results found.  Procedures Incision and Drainage  Date/Time: 04/17/2020 9:45 AM Performed by: Mickie Bail, NP Authorized by: Mickie Bail, NP   Consent:    Consent obtained:  Verbal   Consent given by:  Patient   Risks discussed:  Bleeding, incomplete drainage and infection Location:    Type:  Abscess Pre-procedure details:    Skin preparation:  Betadine Anesthesia (see MAR for exact dosages):    Anesthesia method:  Local infiltration   Local anesthetic:  Lidocaine 1% w/o epi Procedure details:    Incision types:  Single straight   Scalpel blade:  11   Drainage:  Bloody and purulent   Drainage amount:  Scant   Wound treatment:  Wound left open   Packing materials:  None Post-procedure details:    Patient tolerance of procedure:  Tolerated well, no immediate complications   (including critical care time)  Medications Ordered in UC Medications - No data to  display  Initial Impression / Assessment and Plan / UC Course  I have reviewed the triage vital signs and the  nursing notes.  Pertinent labs & imaging results that were available during my care of the patient were reviewed by me and considered in my medical decision making (see chart for details).   Abscess of left groin.  I&D performed.  Wound care instructions and signs of worsening infection discussed with patient.  Treating with Septra DS.  Instructed patient to return here if she notes signs of worsening infection.  Patient agrees to plan of care.     Final Clinical Impressions(s) / UC Diagnoses   Final diagnoses:  Abscess of left groin     Discharge Instructions     Take the antibiotic as directed.    Keep your wound clean and dry.  Wash it gently twice a day with soap and water.  Apply the antibiotic cream twice a day.    Return here if you see signs of infection, such as increased pain, redness, warmth, fever, chills, or other concerning symptoms.       ED Prescriptions    Medication Sig Dispense Auth. Provider   sulfamethoxazole-trimethoprim (BACTRIM DS) 800-160 MG tablet Take 1 tablet by mouth 2 (two) times daily for 7 days. 14 tablet Mickie Bail, NP   fluconazole (DIFLUCAN) 150 MG tablet Take 1 tablet (150 mg total) by mouth daily. Take one tablet today.  May repeat in 3 days. 2 tablet Mickie Bail, NP     PDMP not reviewed this encounter.   Mickie Bail, NP 04/17/20 [phone]

## 2020-04-17 NOTE — ED Triage Notes (Signed)
Patient reports abscess in the left groin x1.5 weeks. Patient reports she has tried warm baths and compresses with minimal relief. Reports that it "feels like it's coming to a head"

## 2020-04-30 DIAGNOSIS — E1165 Type 2 diabetes mellitus with hyperglycemia: Secondary | ICD-10-CM | POA: Diagnosis not present

## 2020-04-30 DIAGNOSIS — R404 Transient alteration of awareness: Secondary | ICD-10-CM | POA: Diagnosis not present

## 2020-04-30 DIAGNOSIS — E161 Other hypoglycemia: Secondary | ICD-10-CM | POA: Diagnosis not present

## 2020-04-30 DIAGNOSIS — E162 Hypoglycemia, unspecified: Secondary | ICD-10-CM | POA: Diagnosis not present

## 2020-05-13 ENCOUNTER — Encounter: Payer: BC Managed Care – PPO | Admitting: Internal Medicine

## 2020-05-22 ENCOUNTER — Encounter: Payer: BC Managed Care – PPO | Admitting: Internal Medicine

## 2020-06-10 ENCOUNTER — Other Ambulatory Visit: Payer: Self-pay

## 2020-06-10 ENCOUNTER — Encounter: Payer: Self-pay | Admitting: Internal Medicine

## 2020-06-11 ENCOUNTER — Ambulatory Visit: Payer: BC Managed Care – PPO | Admitting: Internal Medicine

## 2020-06-11 ENCOUNTER — Encounter: Payer: Self-pay | Admitting: Internal Medicine

## 2020-06-11 VITALS — BP 128/64 | HR 78 | Temp 98.0°F | Ht 65.0 in | Wt 150.4 lb

## 2020-06-11 DIAGNOSIS — I10 Essential (primary) hypertension: Secondary | ICD-10-CM

## 2020-06-11 DIAGNOSIS — Z0000 Encounter for general adult medical examination without abnormal findings: Secondary | ICD-10-CM | POA: Diagnosis not present

## 2020-06-11 DIAGNOSIS — Z79899 Other long term (current) drug therapy: Secondary | ICD-10-CM | POA: Diagnosis not present

## 2020-06-11 DIAGNOSIS — N63 Unspecified lump in unspecified breast: Secondary | ICD-10-CM | POA: Diagnosis not present

## 2020-06-11 DIAGNOSIS — E559 Vitamin D deficiency, unspecified: Secondary | ICD-10-CM | POA: Diagnosis not present

## 2020-06-11 LAB — POCT UA - MICROALBUMIN
Albumin/Creatinine Ratio, Urine, POC: <30
Creatinine, POC: 300 mg/dL
Microalbumin Ur, POC: 10 mg/L

## 2020-06-11 LAB — POCT URINALYSIS DIPSTICK
Bilirubin, UA: NEGATIVE
Glucose, UA: NEGATIVE
Ketones, UA: NEGATIVE
Leukocytes, UA: NEGATIVE
Nitrite, UA: NEGATIVE
Protein, UA: NEGATIVE
Spec Grav, UA: >=1.03 — AB (ref 1.010–1.025)
Urobilinogen, UA: 0.2 E.U./dL
pH, UA: 5.5 (ref 5.0–8.0)

## 2020-06-11 NOTE — Progress Notes (Signed)
I,Tianna Badgett,acting as a Education administrator for Maximino Greenland, MD.,have documented all relevant documentation on the behalf of Maximino Greenland, MD,as directed by  Maximino Greenland, MD while in the presence of Maximino Greenland, MD.  This visit occurred during the SARS-CoV-2 public health emergency.  Safety protocols were in place, including screening questions prior to the visit, additional usage of staff PPE, and extensive cleaning of exam room while observing appropriate contact time as indicated for disinfecting solutions.  Subjective:     Patient ID: Cheryl Hill , female    DOB: 09/21/1976 , 44 y.o.   MRN: [id number]   Chief Complaint  Patient presents with  . Annual Exam  . Hypertension    HPI  She is here today for a full physical examination. She is followed by for her Artelia Laroche, midwife for her GYN exams. She has no specific concerns or complaints at this time.   Hypertension This is a chronic problem. The current episode started more than 1 year ago. The problem has been gradually improving since onset. The problem is controlled. Pertinent negatives include no blurred vision, chest pain, palpitations or shortness of breath. Past treatments include angiotensin blockers. The current treatment provides moderate improvement.     Past Medical History:  Diagnosis Date  . Atopic dermatitis 11/10/2016  . Hypertension      Family History  Problem Relation Age of Onset  . Diabetes Mother   . Hypertension Mother   . Hyperlipidemia Mother   . Cataracts Mother   . Obesity Mother   . Hyperlipidemia Father   . Hypertension Father   . Prostate cancer Father   . Diabetes Father      Current Outpatient Medications:  .  etonogestrel (NEXPLANON) 68 MG IMPL implant, Inject into the skin., Disp: , Rfl:  .  fluconazole (DIFLUCAN) 150 MG tablet, Take 1 tablet (150 mg total) by mouth daily. Take one tablet today.  May repeat in 3 days., Disp: 2 tablet, Rfl: 0 .  losartan (COZAAR) 50  MG tablet, Take 1 tablet (50 mg total) by mouth daily., Disp: 90 tablet, Rfl: 2   No Known Allergies    The patient states she uses Nexplanon for birth control. Last LMP was No LMP recorded. Patient has had an implant.. Negative for Dysmenorrhea. Negative for: breast discharge, breast lump(s), breast pain and breast self exam. Associated symptoms include abnormal vaginal bleeding. Pertinent negatives include abnormal bleeding (hematology), anxiety, decreased libido, depression, difficulty falling sleep, dyspareunia, history of infertility, nocturia, sexual dysfunction, sleep disturbances, urinary incontinence, urinary urgency, vaginal discharge and vaginal itching. Diet regular.The patient states her exercise level is  intermittent.   . The patient's tobacco use is:  Social History   Tobacco Use  Smoking Status Never Smoker  Smokeless Tobacco Never Used  . She has been exposed to passive smoke. The patient's alcohol use is:  Social History   Substance and Sexual Activity  Alcohol Use Never      Review of Systems  Constitutional: Negative.   HENT: Negative.   Eyes: Negative.  Negative for blurred vision.  Respiratory: Negative.  Negative for shortness of breath.   Cardiovascular: Negative.  Negative for chest pain and palpitations.  Gastrointestinal: Negative.   Endocrine: Negative.   Genitourinary: Negative.   Musculoskeletal: Negative.   Skin: Negative.   Allergic/Immunologic: Negative.   Neurological: Negative.   Hematological: Negative.   Psychiatric/Behavioral: Negative.      Today's Vitals   06/11/20 1152  BP:  128/64  Pulse: 78  Temp: 98 F (36.7 C)  Weight: 150 lb 6.4 oz (68.2 kg)  Height: _0  (1.651 m)  PainSc: 0-No pain   Body mass index is 25.03 kg/m.   Objective:  Physical Exam Vitals and nursing note reviewed.  Constitutional:      Appearance: Normal appearance.  HENT:     Head: Normocephalic and atraumatic.     Right Ear: Tympanic membrane, ear  canal and external ear normal.     Left Ear: Tympanic membrane, ear canal and external ear normal.     Nose:     Comments: Deferred, masked    Mouth/Throat:     Comments: Deferred, masked Eyes:     Extraocular Movements: Extraocular movements intact.     Conjunctiva/sclera: Conjunctivae normal.     Pupils: Pupils are equal, round, and reactive to light.  Cardiovascular:     Rate and Rhythm: Normal rate and regular rhythm.     Pulses: Normal pulses.     Heart sounds: Normal heart sounds.  Pulmonary:     Effort: Pulmonary effort is normal.     Breath sounds: Normal breath sounds.  Chest:     Breasts: Tanner Score is 5.        Right: Mass present.        Left: Normal.       Comments: Dense breast tissue b/l.  Discrete mass palpated on right.  Abdominal:     General: Abdomen is flat. Bowel sounds are normal.     Palpations: Abdomen is soft.  Genitourinary:    Comments: deferred Musculoskeletal:        General: Normal range of motion.     Cervical back: Normal range of motion and neck supple.  Skin:    General: Skin is warm and dry.  Neurological:     General: No focal deficit present.     Mental Status: She is alert and oriented to person, place, and time.  Psychiatric:        Mood and Affect: Mood normal.        Behavior: Behavior normal.         Assessment And Plan:     1. Routine general medical examination at health care facility Comments: A full exam was performed.  Importance of monthly SBE was discussed with the patient. PATIENT IS ADVISED TO GET 30-45 MINUTES REGULAR EXERCISE NO LESS THAN FOUR TO FIVE DAYS PER WEEK - BOTH WEIGHTBEARING EXERCISES AND AEROBIC ARE RECOMMENDED.  PATIENT IS ADVISED TO FOLLOW A HEALTHY DIET WITH AT LEAST SIX FRUITS/VEGGIES PER DAY, DECREASE INTAKE OF RED MEAT, AND TO INCREASE FISH INTAKE TO TWO DAYS PER WEEK.  MEATS/FISH SHOULD NOT BE FRIED, BAKED OR BROILED IS PREFERABLE.  I SUGGEST WEARING SPF 50 SUNSCREEN ON EXPOSED PARTS AND  ESPECIALLY WHEN IN THE DIRECT SUNLIGHT FOR AN EXTENDED PERIOD OF TIME.  PLEASE AVOID FAST FOOD RESTAURANTS AND INCREASE YOUR WATER INTAKE.  - CMP14+EGFR - VITAMIN D 25 Hydroxy (Vit-D Deficiency, Fractures) - Hepatitis C antibody - CBC - Lipid panel  2. Essential hypertension, benign Comments: Chronic. Controlled. Will continue with current medications.  Encouraged to avoid adding salt to her foods. EKG performed, no acute changes noted. She will rto in six months for re-evaluation.  - POCT Urinalysis Dipstick (81002) - POCT UA - Microalbumin - EKG 12-Lead  3. Breast lump Comments: I will refer her for R breast ultrasound and diagnostic mammogram on R. She is in agreement with her  treatment plan.  - MM Digital Diagnostic Unilat R; Future - [place] Unlisted Procedure Breast; Future     Patient was given opportunity to ask questions. Patient verbalized understanding of the plan and was able to repeat key elements of the plan. All questions were answered to their satisfaction.   Maximino Greenland, MD   I, Maximino Greenland, MD, have reviewed all documentation for this visit. The documentation on 06/17/20 for the exam, diagnosis, procedures, and orders are all accurate and complete.  THE PATIENT IS ENCOURAGED TO PRACTICE SOCIAL DISTANCING DUE TO THE COVID-19 PANDEMIC.

## 2020-06-11 NOTE — Patient Instructions (Signed)
Health Maintenance, Female Adopting a healthy lifestyle and getting preventive care are important in promoting health and wellness. Ask your health care provider about:  The right schedule for you to have regular tests and exams.  Things you can do on your own to prevent diseases and keep yourself healthy. What should I know about diet, weight, and exercise? Eat a healthy diet   Eat a diet that includes plenty of vegetables, fruits, low-fat dairy products, and lean protein.  Do not eat a lot of foods that are high in solid fats, added sugars, or sodium. Maintain a healthy weight Body mass index (BMI) is used to identify weight problems. It estimates body fat based on height and weight. Your health care provider can help determine your BMI and help you achieve or maintain a healthy weight. Get regular exercise Get regular exercise. This is one of the most important things you can do for your health. Most adults should:  Exercise for at least 150 minutes each week. The exercise should increase your heart rate and make you sweat (moderate-intensity exercise).  Do strengthening exercises at least twice a week. This is in addition to the moderate-intensity exercise.  Spend less time sitting. Even light physical activity can be beneficial. Watch cholesterol and blood lipids Have your blood tested for lipids and cholesterol at 44 years of age, then have this test every 5 years. Have your cholesterol levels checked more often if:  Your lipid or cholesterol levels are high.  You are older than 44 years of age.  You are at high risk for heart disease. What should I know about cancer screening? Depending on your health history and family history, you may need to have cancer screening at various ages. This may include screening for:  Breast cancer.  Cervical cancer.  Colorectal cancer.  Skin cancer.  Lung cancer. What should I know about heart disease, diabetes, and high blood  pressure? Blood pressure and heart disease  High blood pressure causes heart disease and increases the risk of stroke. This is more likely to develop in people who have high blood pressure readings, are of African descent, or are overweight.  Have your blood pressure checked: ? Every 3-5 years if you are 18-39 years of age. ? Every year if you are 40 years old or older. Diabetes Have regular diabetes screenings. This checks your fasting blood sugar level. Have the screening done:  Once every three years after age 40 if you are at a normal weight and have a low risk for diabetes.  More often and at a younger age if you are overweight or have a high risk for diabetes. What should I know about preventing infection? Hepatitis B If you have a higher risk for hepatitis B, you should be screened for this virus. Talk with your health care provider to find out if you are at risk for hepatitis B infection. Hepatitis C Testing is recommended for:  Everyone born from 1945 through 1965.  Anyone with known risk factors for hepatitis C. Sexually transmitted infections (STIs)  Get screened for STIs, including gonorrhea and chlamydia, if: ? You are sexually active and are younger than 44 years of age. ? You are older than 44 years of age and your health care provider tells you that you are at risk for this type of infection. ? Your sexual activity has changed since you were last screened, and you are at increased risk for chlamydia or gonorrhea. Ask your health care provider if   you are at risk.  Ask your health care provider about whether you are at high risk for HIV. Your health care provider may recommend a prescription medicine to help prevent HIV infection. If you choose to take medicine to prevent HIV, you should first get tested for HIV. You should then be tested every 3 months for as long as you are taking the medicine. Pregnancy  If you are about to stop having your period (premenopausal) and  you may become pregnant, seek counseling before you get pregnant.  Take 400 to 800 micrograms (mcg) of folic acid every day if you become pregnant.  Ask for birth control (contraception) if you want to prevent pregnancy. Osteoporosis and menopause Osteoporosis is a disease in which the bones lose minerals and strength with aging. This can result in bone fractures. If you are 65 years old or older, or if you are at risk for osteoporosis and fractures, ask your health care provider if you should:  Be screened for bone loss.  Take a calcium or vitamin D supplement to lower your risk of fractures.  Be given hormone replacement therapy (HRT) to treat symptoms of menopause. Follow these instructions at home: Lifestyle  Do not use any products that contain nicotine or tobacco, such as cigarettes, e-cigarettes, and chewing tobacco. If you need help quitting, ask your health care provider.  Do not use street drugs.  Do not share needles.  Ask your health care provider for help if you need support or information about quitting drugs. Alcohol use  Do not drink alcohol if: ? Your health care provider tells you not to drink. ? You are pregnant, may be pregnant, or are planning to become pregnant.  If you drink alcohol: ? Limit how much you use to 0-1 drink a day. ? Limit intake if you are breastfeeding.  Be aware of how much alcohol is in your drink. In the [place] one drink equals one 12 oz bottle of beer (355 mL), one 5 oz glass of wine (148 mL), or one 1 oz glass of hard liquor (44 mL). General instructions  Schedule regular health, dental, and eye exams.  Stay current with your vaccines.  Tell your health care provider if: ? You often feel depressed. ? You have ever been abused or do not feel safe at home. Summary  Adopting a healthy lifestyle and getting preventive care are important in promoting health and wellness.  Follow your health care provider's instructions about healthy  diet, exercising, and getting tested or screened for diseases.  Follow your health care provider's instructions on monitoring your cholesterol and blood pressure. This information is not intended to replace advice given to you by your health care provider. Make sure you discuss any questions you have with your health care provider. Document Revised: 09/21/2018 Document Reviewed: 09/21/2018 Elsevier Patient Education  2020 [employer]  

## 2020-06-12 LAB — CMP14+EGFR
ALT: 14 IU/L (ref 0–32)
AST: 17 IU/L (ref 0–40)
Albumin/Globulin Ratio: 1.8 (ref 1.2–2.2)
Albumin: 4.6 g/dL (ref 3.8–4.8)
Alkaline Phosphatase: 36 IU/L — ABNORMAL LOW (ref 48–121)
BUN/Creatinine Ratio: 15 (ref 9–23)
BUN: 14 mg/dL (ref 6–24)
Bilirubin Total: <0.2 mg/dL (ref 0.0–1.2)
CO2: 25 mmol/L (ref 20–29)
Calcium: 9.6 mg/dL (ref 8.7–10.2)
Chloride: 103 mmol/L (ref 96–106)
Creatinine, Ser: 0.91 mg/dL (ref 0.57–1.00)
GFR calc Af Amer: 89 mL/min/{1.73_m2} (ref >59)
GFR calc non Af Amer: 77 mL/min/{1.73_m2} (ref >59)
Globulin, Total: 2.5 g/dL (ref 1.5–4.5)
Glucose: 69 mg/dL (ref 65–99)
Potassium: 4.3 mmol/L (ref 3.5–5.2)
Sodium: 140 mmol/L (ref 134–144)
Total Protein: 7.1 g/dL (ref 6.0–8.5)

## 2020-06-12 LAB — CBC
Hematocrit: 46.6 % (ref 34.0–46.6)
Hemoglobin: 15 g/dL (ref 11.1–15.9)
MCH: 26.4 pg — ABNORMAL LOW (ref 26.6–33.0)
MCHC: 32.2 g/dL (ref 31.5–35.7)
MCV: 82 fL (ref 79–97)
Platelets: 284 10*3/uL (ref 150–450)
RBC: 5.69 x10E6/uL — ABNORMAL HIGH (ref 3.77–5.28)
RDW: 13.2 % (ref 11.7–15.4)
WBC: 8.2 10*3/uL (ref 3.4–10.8)

## 2020-06-12 LAB — LIPID PANEL
Chol/HDL Ratio: 3.2 ratio (ref 0.0–4.4)
Cholesterol, Total: 149 mg/dL (ref 100–199)
HDL: 46 mg/dL (ref >39)
LDL Chol Calc (NIH): 89 mg/dL (ref 0–99)
Triglycerides: 70 mg/dL (ref 0–149)
VLDL Cholesterol Cal: 14 mg/dL (ref 5–40)

## 2020-06-12 LAB — HEPATITIS C ANTIBODY: Hep C Virus Ab: <0.1 s/co ratio (ref 0.0–0.9)

## 2020-06-12 LAB — VITAMIN D 25 HYDROXY (VIT D DEFICIENCY, FRACTURES): Vit D, 25-Hydroxy: 32.8 ng/mL (ref 30.0–100.0)

## 2020-06-18 ENCOUNTER — Other Ambulatory Visit: Payer: Self-pay | Admitting: Family Medicine

## 2020-06-18 DIAGNOSIS — E559 Vitamin D deficiency, unspecified: Secondary | ICD-10-CM

## 2020-07-25 DIAGNOSIS — Z23 Encounter for immunization: Secondary | ICD-10-CM | POA: Diagnosis not present

## 2020-08-02 ENCOUNTER — Other Ambulatory Visit: Payer: Self-pay | Admitting: Internal Medicine

## 2020-08-02 DIAGNOSIS — Z1231 Encounter for screening mammogram for malignant neoplasm of breast: Secondary | ICD-10-CM

## 2020-08-22 ENCOUNTER — Other Ambulatory Visit: Payer: Self-pay | Admitting: Internal Medicine

## 2020-08-22 ENCOUNTER — Ambulatory Visit
Admission: RE | Admit: 2020-08-22 | Discharge: 2020-08-22 | Disposition: A | Discharge status: Home / Self Care | Payer: BC Managed Care – PPO | Source: Ambulatory Visit | Attending: Internal Medicine | Admitting: Internal Medicine

## 2020-08-22 ENCOUNTER — Other Ambulatory Visit: Payer: Self-pay

## 2020-08-22 DIAGNOSIS — R922 Inconclusive mammogram: Secondary | ICD-10-CM | POA: Diagnosis not present

## 2020-08-22 DIAGNOSIS — N6310 Unspecified lump in the right breast, unspecified quadrant: Secondary | ICD-10-CM | POA: Diagnosis not present

## 2020-08-22 DIAGNOSIS — N63 Unspecified lump in unspecified breast: Secondary | ICD-10-CM

## 2020-08-22 DIAGNOSIS — N6320 Unspecified lump in the left breast, unspecified quadrant: Secondary | ICD-10-CM | POA: Insufficient documentation

## 2020-09-03 DIAGNOSIS — Z1152 Encounter for screening for COVID-19: Secondary | ICD-10-CM | POA: Diagnosis not present

## 2020-09-03 DIAGNOSIS — Z03818 Encounter for observation for suspected exposure to other biological agents ruled out: Secondary | ICD-10-CM | POA: Diagnosis not present

## 2020-09-16 ENCOUNTER — Telehealth: Payer: Self-pay | Admitting: Family Medicine

## 2020-09-16 NOTE — Telephone Encounter (Signed)
Pt called and states she has a cold and wanted to know what OTC medicine she can take with her diabetes. Pt can be reached at [phone].

## 2020-09-16 NOTE — Telephone Encounter (Signed)
Pt will do a virtual wednesday

## 2020-09-16 NOTE — Telephone Encounter (Signed)
Taking over the counter medications depends on her symptoms and what we think it the problem. She needs a virtual visit.

## 2020-09-18 ENCOUNTER — Encounter: Payer: Self-pay | Admitting: Family Medicine

## 2020-09-18 ENCOUNTER — Other Ambulatory Visit: Payer: Self-pay

## 2020-09-18 ENCOUNTER — Telehealth (INDEPENDENT_AMBULATORY_CARE_PROVIDER_SITE_OTHER): Payer: 59 | Admitting: Family Medicine

## 2020-09-18 VITALS — Wt 120.0 lb

## 2020-09-18 DIAGNOSIS — I10 Essential (primary) hypertension: Secondary | ICD-10-CM

## 2020-09-18 DIAGNOSIS — E109 Type 1 diabetes mellitus without complications: Secondary | ICD-10-CM

## 2020-09-18 DIAGNOSIS — J3489 Other specified disorders of nose and nasal sinuses: Secondary | ICD-10-CM | POA: Diagnosis not present

## 2020-09-18 DIAGNOSIS — R058 Other specified cough: Secondary | ICD-10-CM | POA: Diagnosis not present

## 2020-09-18 DIAGNOSIS — F17200 Nicotine dependence, unspecified, uncomplicated: Secondary | ICD-10-CM | POA: Diagnosis not present

## 2020-09-18 MED ORDER — AZITHROMYCIN 250 MG PO TABS: ORAL_TABLET | ORAL | 0 refills | Status: AC

## 2020-09-18 NOTE — Progress Notes (Signed)
   Subjective:  Documentation for virtual audio and video telecommunications through Caregility encounter:  The patient was located at home. 2 patient identifiers used.  The provider was located in the office. The patient did consent to this visit and is aware of possible charges through their insurance for this visit.  The other persons participating in this telemedicine service were none. Time spent on call was 16 minutes and in review of previous records 20 minutes total.  This virtual service is not related to other E/M service within previous 7 days.   Patient ID: Alyssa Norman, female    DOB: 05-26-1976, 44 y.o.   MRN: [id number]  HPI Chief Complaint  Patient presents with  . sick    sick- cough, sneezing, runny nose, coughing up phelgm, achy- since saturday. covid test-monday- negative   Complains of a 5 day history of rhinorrhea, sneezing, nasal congestion, sinus pain, upper back ache, coughing up yellow mucus that is thick.   No fever, chills, loss of taste or smell, sore throat, chest pain, palpitations, shortness of breath, wheezing, abdominal pain, N/V/D.   Taking Mucinex and BC powder.   States she was tested for Covid 2 days ago and negative.   States she had all 3 Covid vaccines.  Smoker   HTN- BP at work on Monday was   Diabetes- seeing endocrinologist  LMP: last week   Reviewed allergies, medications, past medical, surgical, family, and social history.    Review of Systems Pertinent positives and negatives in the history of present illness.     Objective:   Physical Exam Wt 120 lb (54.4 kg)   BMI 21.95 kg/m   Alert and oriented in no acute distress.  Congested cough during the visit.  Respirations unlabored.  Speaking in complete sentences without difficulty.      Assessment & Plan:  Productive cough - Plan: azithromycin (ZITHROMAX) 250 MG tablet  Smoker  Sinus pain  Primary hypertension  Type 1 diabetes mellitus without complications  (HCC)  No acute distress.  Negative Covid test 2 days ago and she is fully vaccinated.  I will prescribe azithromycin and she may start this or give herself an couple more days to see if she is turning the corner on her own.  Discussed that I have a lower threshold to put her on an antibiotic for her symptoms based on her smoking history.  Discussed symptomatic treatment.  Follow-up if worsening or not back to baseline 10 days after starting the antibiotic. Advised her to follow-up for hypertension if her blood pressures are continuing to be elevated after she is back to her usual state of health.  Advised her that she needs to follow-up with her endocrinologist for her type 1 diabetes.

## 2020-10-02 ENCOUNTER — Other Ambulatory Visit: Payer: Self-pay | Admitting: Family Medicine

## 2020-10-02 DIAGNOSIS — I10 Essential (primary) hypertension: Secondary | ICD-10-CM

## 2020-10-18 DIAGNOSIS — Z20822 Contact with and (suspected) exposure to covid-19: Secondary | ICD-10-CM | POA: Diagnosis not present

## 2020-10-23 ENCOUNTER — Other Ambulatory Visit: Payer: Self-pay | Admitting: Internal Medicine

## 2020-11-02 DIAGNOSIS — J019 Acute sinusitis, unspecified: Secondary | ICD-10-CM | POA: Diagnosis not present

## 2020-12-10 ENCOUNTER — Other Ambulatory Visit: Payer: Self-pay

## 2020-12-10 ENCOUNTER — Encounter: Payer: Self-pay | Admitting: Internal Medicine

## 2020-12-10 ENCOUNTER — Ambulatory Visit: Payer: BC Managed Care – PPO | Admitting: Internal Medicine

## 2020-12-10 VITALS — BP 122/88 | HR 97 | Temp 98.1°F | Ht 65.0 in | Wt 150.8 lb

## 2020-12-10 DIAGNOSIS — Z6825 Body mass index (BMI) 25.0-25.9, adult: Secondary | ICD-10-CM

## 2020-12-10 DIAGNOSIS — I10 Essential (primary) hypertension: Secondary | ICD-10-CM | POA: Diagnosis not present

## 2020-12-10 LAB — CMP14+EGFR
ALT: 10 IU/L (ref 0–32)
AST: 16 IU/L (ref 0–40)
Albumin/Globulin Ratio: 1.8 (ref 1.2–2.2)
Albumin: 4.4 g/dL (ref 3.8–4.8)
Alkaline Phosphatase: 38 IU/L — ABNORMAL LOW (ref 44–121)
BUN/Creatinine Ratio: 11 (ref 9–23)
BUN: 10 mg/dL (ref 6–24)
Bilirubin Total: 0.4 mg/dL (ref 0.0–1.2)
CO2: 23 mmol/L (ref 20–29)
Calcium: 9.3 mg/dL (ref 8.7–10.2)
Chloride: 100 mmol/L (ref 96–106)
Creatinine, Ser: 0.87 mg/dL (ref 0.57–1.00)
Globulin, Total: 2.5 g/dL (ref 1.5–4.5)
Glucose: 76 mg/dL (ref 65–99)
Potassium: 4 mmol/L (ref 3.5–5.2)
Sodium: 135 mmol/L (ref 134–144)
Total Protein: 6.9 g/dL (ref 6.0–8.5)
eGFR: 84 mL/min/{1.73_m2} (ref >59)

## 2020-12-10 MED ORDER — METOPROLOL SUCCINATE ER 25 MG PO TB24: 25.0000 mg | ORAL_TABLET | Freq: Every day | ORAL | 11 refills | Status: DC

## 2020-12-10 NOTE — Patient Instructions (Addendum)
Magnesium glycinate - one nightly  Cooking With Less Salt Cooking with less salt is one way to reduce the amount of sodium you get from food. Sodium is one of the elements that make up salt. It is found naturally in foods and is also added to certain foods. Depending on your condition and overall health, your health care provider or dietitian may recommend that you reduce your sodium intake. Most people should have less than 2,300 milligrams (mg) of sodium each day. If you have high blood pressure (hypertension), you may need to limit your sodium to 1,500 mg each day. Follow the tips below to help reduce your sodium intake. What are tips for eating less sodium? Reading food labels  Check the food label before buying or using packaged ingredients. Always check the label for the serving size and sodium content.  Look for products with no more than 140 mg of sodium in one serving.  Check the % Daily Value column to see what percent of the daily recommended amount of sodium is provided in one serving of the product. Foods with 5% or less in this column are considered low in sodium. Foods with 20% or higher are considered high in sodium.  Do not choose foods with salt as one of the first three ingredients on the ingredients list. If salt is one of the first three ingredients, it usually means the item is high in sodium.   Shopping  Buy sodium-free or low-sodium products. Look for the following words on food labels: ? Low-sodium. ? Sodium-free. ? Reduced-sodium. ? No salt added. ? Unsalted.  Always check the sodium content even if foods are labeled as low-sodium or no salt added.  Buy fresh foods. Cooking  Use herbs, seasonings without salt, and spices as substitutes for salt.  Use sodium-free baking soda when baking.  Grill, braise, or roast foods to add flavor with less salt.  Avoid adding salt to pasta, rice, or hot cereals.  Drain and rinse canned vegetables, beans, and meat before  use.  Avoid adding salt when cooking sweets and desserts.  Cook with low-sodium ingredients. What foods are high in sodium? Vegetables Regular canned vegetables (not low-sodium or reduced-sodium). Sauerkraut, pickled vegetables, and relishes. Olives. [place] fries. Onion rings. Regular canned tomato sauce and paste. Regular tomato and vegetable juice. Frozen vegetables in sauces. Grains Instant hot cereals. Bread stuffing, pancake, and biscuit mixes. Croutons. Seasoned rice or pasta mixes. Noodle soup cups. Boxed or frozen macaroni and cheese. Regular salted crackers. Self-rising flour. Rolls. Bagels. Flour tortillas and wraps. Meats and other proteins Meat or fish that is salted, canned, smoked, cured, spiced, or pickled. This includes bacon, ham, sausages, hot dogs, corned beef, chipped beef, meat loaves, salt pork, jerky, pickled herring, anchovies, regular canned tuna, and sardines. Salted nuts. Dairy Processed cheese and cheese spreads. Cheese curds. Blue cheese. Feta cheese. String cheese. Regular cottage cheese. Buttermilk. Canned milk. The items listed above may not be a complete list of foods high in sodium. Actual amounts of sodium may be different depending on processing. Contact a dietitian for more information. What foods are low in sodium? Fruits Fresh, frozen, or canned fruit with no sauce added. Fruit juice. Vegetables Fresh or frozen vegetables with no sauce added. "No salt added" canned vegetables. "No salt added" tomato sauce and paste. Low-sodium or reduced-sodium tomato and vegetable juice. Grains Noodles, pasta, quinoa, rice. Shredded or puffed wheat or puffed rice. Regular or quick oats (not instant). Low-sodium crackers. Low-sodium bread. Whole-grain  bread and whole-grain pasta. Unsalted popcorn. Meats and other proteins Fresh or frozen whole meats, poultry (not injected with sodium), and fish with no sauce added. Unsalted nuts. Dried peas, beans, and lentils without  added salt. Unsalted canned beans. Eggs. Unsalted nut butters. Low-sodium canned tuna or chicken. Dairy Milk. Soy milk. Yogurt. Low-sodium cheeses, such as Swiss, [address], [place], and [employer]. Sherbet or ice cream (keep to  cup per serving). Cream cheese. Fats and oils Unsalted butter or margarine. Other foods Homemade pudding. Sodium-free baking soda and baking powder. Herbs and spices. Low-sodium seasoning mixes. Beverages Coffee and tea. Carbonated beverages. The items listed above may not be a complete list of foods low in sodium. Actual amounts of sodium may be different depending on processing. Contact a dietitian for more information. What are some salt alternatives when cooking? The following are herbs, seasonings, and spices that can be used instead of salt to flavor your food. Herbs should be fresh or dried. Do not choose packaged mixes. Next to the name of the herb, spice, or seasoning are some examples of foods you can pair it with. Herbs  Bay leaves - Soups, meat and vegetable dishes, and spaghetti sauce.  Basil - [employer], soups, pasta, and fish dishes.  Cilantro - Meat, poultry, and vegetable dishes.  Chili powder - Marinades and Mexican dishes.  Chives - Salad dressings and potato dishes.  Cumin - Mexican dishes, couscous, and meat dishes.  Dill - Fish dishes, sauces, and salads.  Fennel - Meat and vegetable dishes, breads, and cookies.  Garlic (do not use garlic salt) - [place] dishes, meat dishes, salad dressings, and sauces.  Marjoram - Soups, potato dishes, and meat dishes.  Oregano - Pizza and spaghetti sauce.  Parsley - Salads, soups, pasta, and meat dishes.  Rosemary - [place] dishes, salad dressings, soups, and red meats.  Saffron - Fish dishes, pasta, and some poultry dishes.  Sage - Stuffings and sauces.  Tarragon - Fish and [employer].  Thyme - Stuffing, meat, and fish dishes. Seasonings  Lemon juice - Fish dishes,  poultry dishes, vegetables, and salads.  Vinegar - Salad dressings, vegetables, and fish dishes. Spices  Cinnamon - Sweet dishes, such as cakes, cookies, and puddings.  Cloves - Gingerbread, puddings, and marinades for meats.  Curry - Vegetable dishes, fish and poultry dishes, and stir-fry dishes.  Ginger - Vegetable dishes, fish dishes, and stir-fry dishes.  Nutmeg - Pasta, vegetables, poultry, fish dishes, and custard. Summary  Cooking with less salt is one way to reduce the amount of sodium that you get from food.  Buy sodium-free or low-sodium products.  Check the food label before using or buying packaged ingredients.  Use herbs, seasonings without salt, and spices as substitutes for salt in foods. This information is not intended to replace advice given to you by your health care provider. Make sure you discuss any questions you have with your health care provider. Document Revised: 09/20/2019 Document Reviewed: 09/20/2019 Elsevier Patient Education  2021 [employer].

## 2020-12-10 NOTE — Progress Notes (Signed)
I,[name],acting as a Education administrator for Maximino Greenland, MD.,have documented all relevant documentation on the behalf of Maximino Greenland, MD,as directed by  Maximino Greenland, MD while in the presence of Maximino Greenland, MD.  This visit occurred during the SARS-CoV-2 public health emergency.  Safety protocols were in place, including screening questions prior to the visit, additional usage of staff PPE, and extensive cleaning of exam room while observing appropriate contact time as indicated for disinfecting solutions.  Subjective:     Patient ID: Cheryl Hill , female    DOB: [birth year] , 45 y.o.   MRN: [id number]   Chief Complaint  Patient presents with  . Hypertension    HPI  The patient is here today for a blood pressure follow-up. She reports compliance with meds. She denies having any headaches, chest pain and shortness of breath.   Hypertension This is a chronic problem. The current episode started more than 1 year ago. The problem has been gradually improving since onset. The problem is controlled. Pertinent negatives include no blurred vision, chest pain, palpitations or shortness of breath. Past treatments include angiotensin blockers. The current treatment provides moderate improvement.     Past Medical History:  Diagnosis Date  . Atopic dermatitis 11/10/2016  . Hypertension      Family History  Problem Relation Age of Onset  . Diabetes Mother   . Hypertension Mother   . Hyperlipidemia Mother   . Cataracts Mother   . Obesity Mother   . Hyperlipidemia Father   . Hypertension Father   . Prostate cancer Father   . Diabetes Father      Current Outpatient Medications:  .  etonogestrel (NEXPLANON) 68 MG IMPL implant, Inject into the skin., Disp: , Rfl:  .  losartan (COZAAR) 50 MG tablet, TAKE 1 TABLET BY MOUTH EVERY DAY, Disp: 90 tablet, Rfl: 2 .  metoprolol succinate (TOPROL XL) 25 MG 24 hr tablet, Take 1 tablet (25 mg total) by mouth daily., Disp: 30  tablet, Rfl: 11 .  Multiple Vitamin (MULTIVITAMIN) tablet, Take 1 tablet by mouth daily., Disp: , Rfl:    No Known Allergies   Review of Systems  Constitutional: Negative.   Eyes: Negative for blurred vision.  Respiratory: Negative.  Negative for shortness of breath.   Cardiovascular: Negative.  Negative for chest pain and palpitations.  Gastrointestinal: Negative.   Psychiatric/Behavioral: Negative.   All other systems reviewed and are negative.    Today's Vitals   12/10/20 1002  BP: 122/88  Pulse: 97  Temp: 98.1 F (36.7 C)  TempSrc: Oral  Weight: 150 lb 12.8 oz (68.4 kg)  Height: _0  (1.651 m)   Body mass index is 25.09 kg/m.  Wt Readings from Last 3 Encounters:  12/10/20 150 lb 12.8 oz (68.4 kg)  06/11/20 150 lb 6.4 oz (68.2 kg)  11/08/19 144 lb 6.4 oz (65.5 kg)   Objective:  Physical Exam Vitals and nursing note reviewed.  Constitutional:      Appearance: Normal appearance.  HENT:     Head: Normocephalic and atraumatic.     Nose:     Comments: Masked     Mouth/Throat:     Comments: Masked  Cardiovascular:     Rate and Rhythm: Normal rate and regular rhythm.     Heart sounds: Normal heart sounds.  Pulmonary:     Breath sounds: Normal breath sounds.  Musculoskeletal:     Cervical back: Normal range of motion.  Skin:  General: Skin is warm.  Neurological:     General: No focal deficit present.     Mental Status: She is alert and oriented to person, place, and time.         Assessment And Plan:     1. Essential hypertension, benign Comments: Chronic, fair control. Pt made aware of diastolic elevation. Advised to follow low sodium diet. I iwill check renal function today. She wil f/u in 4 months.  - CMP14+EGFR  2. Body mass index (BMI) 25.0-25.9, adult Comments: She is encouraged to aim for at least 150 minutes of exercise per week.    Patient was given opportunity to ask questions. Patient verbalized understanding of the plan and was able to  repeat key elements of the plan. All questions were answered to their satisfaction.   I, Maximino Greenland, MD, have reviewed all documentation for this visit. The documentation on 12/10/20 for the exam, diagnosis, procedures, and orders are all accurate and complete.  THE PATIENT IS ENCOURAGED TO PRACTICE SOCIAL DISTANCING DUE TO THE COVID-19 PANDEMIC.

## 2020-12-18 DIAGNOSIS — H5213 Myopia, bilateral: Secondary | ICD-10-CM | POA: Diagnosis not present

## 2020-12-18 DIAGNOSIS — D3131 Benign neoplasm of right choroid: Secondary | ICD-10-CM | POA: Diagnosis not present

## 2020-12-18 DIAGNOSIS — H524 Presbyopia: Secondary | ICD-10-CM | POA: Diagnosis not present

## 2020-12-18 DIAGNOSIS — H04123 Dry eye syndrome of bilateral lacrimal glands: Secondary | ICD-10-CM | POA: Diagnosis not present

## 2020-12-24 ENCOUNTER — Telehealth: Payer: Self-pay | Admitting: Endocrinology

## 2020-12-24 DIAGNOSIS — E109 Type 1 diabetes mellitus without complications: Secondary | ICD-10-CM

## 2020-12-24 NOTE — Telephone Encounter (Signed)
Pt states her insurance switched which medication they covered and will now cover Basaglar. Pt states pharmacy sent a fax for a prescription for her Basaglar and is asking if we have received it?   Basaglar 90 day refill  Please send to: [id number] [place], [place] - [address] AT [facility] & [address] Phone:  [phone]  Fax:  [phone]

## 2020-12-25 MED ORDER — BASAGLAR KWIKPEN 100 UNIT/ML ~~LOC~~ SOPN: 8.0000 [IU] | PEN_INJECTOR | Freq: Every day | SUBCUTANEOUS | 0 refills | Status: DC

## 2020-12-25 NOTE — Telephone Encounter (Signed)
Please Advise

## 2020-12-25 NOTE — Telephone Encounter (Signed)
I changed in med list. 1.  Please schedule f/u appt 2.  Then please refill x 2 mos, pending that appt.

## 2020-12-25 NOTE — Telephone Encounter (Signed)
Please schedule pt for F/U and let me know in order for refills.  Thank you,  Christy Gentles

## 2020-12-26 ENCOUNTER — Other Ambulatory Visit: Payer: Self-pay

## 2020-12-26 MED ORDER — BASAGLAR KWIKPEN 100 UNIT/ML ~~LOC~~ SOPN: 8.0000 [IU] | PEN_INJECTOR | Freq: Every day | SUBCUTANEOUS | 2 refills | Status: DC

## 2020-12-26 NOTE — Telephone Encounter (Signed)
Pt scheduled for 4/25 at 11:15 am

## 2020-12-26 NOTE — Telephone Encounter (Signed)
LMTCB to sch

## 2021-01-02 MED ORDER — BASAGLAR KWIKPEN 100 UNIT/ML ~~LOC~~ SOPN: 8.0000 [IU] | PEN_INJECTOR | Freq: Every day | SUBCUTANEOUS | 0 refills | Status: DC

## 2021-01-02 NOTE — Addendum Note (Signed)
Addended by: Kenyon Ana on: 01/02/2021 04:48 PM   Modules accepted: Orders

## 2021-01-02 NOTE — Telephone Encounter (Signed)
Please send Basaglar to [facility] on [address] (patient's preferred pharmacy) - appointment has been scheduled

## 2021-01-02 NOTE — Telephone Encounter (Signed)
Rx sent to preferred pharmacy.

## 2021-01-21 ENCOUNTER — Encounter: Payer: Self-pay | Admitting: Internal Medicine

## 2021-01-21 ENCOUNTER — Other Ambulatory Visit: Payer: Self-pay

## 2021-01-21 ENCOUNTER — Ambulatory Visit: Payer: BC Managed Care – PPO | Admitting: Internal Medicine

## 2021-01-21 VITALS — BP 126/80 | HR 67 | Temp 98.3°F | Ht 65.0 in | Wt 151.0 lb

## 2021-01-21 DIAGNOSIS — R000 Tachycardia, unspecified: Secondary | ICD-10-CM

## 2021-01-21 DIAGNOSIS — I10 Essential (primary) hypertension: Secondary | ICD-10-CM

## 2021-01-21 DIAGNOSIS — Z308 Encounter for other contraceptive management: Secondary | ICD-10-CM | POA: Diagnosis not present

## 2021-01-21 DIAGNOSIS — R946 Abnormal results of thyroid function studies: Secondary | ICD-10-CM | POA: Diagnosis not present

## 2021-01-21 NOTE — Patient Instructions (Addendum)
Sinus Tachycardia  Sinus tachycardia is a kind of fast heartbeat. In sinus tachycardia, the heart beats more than 100 times a minute. Sinus tachycardia starts in a part of the heart called the sinus node. Sinus tachycardia may be harmless, or it may be a sign of a serious condition. What are the causes? This condition may be caused by:  Exercise or exertion.  A fever.  Pain.  Loss of body fluids (dehydration).  Severe bleeding (hemorrhage).  Anxiety and stress.  Certain substances, including: ? Alcohol. ? Caffeine. ? Tobacco and nicotine products. ? Cold medicines. ? Illegal drugs.  Medical conditions including: ? Heart disease. ? An infection. ? An overactive thyroid (hyperthyroidism). ? A lack of red blood cells (anemia). What are the signs or symptoms? Symptoms of this condition include:  A feeling that the heart is beating quickly (palpitations).  Suddenly noticing your heartbeat (cardiac awareness).  Dizziness.  Tiredness (fatigue).  Shortness of breath.  Chest pain.  Nausea.  Fainting. How is this diagnosed? This condition is diagnosed with:  A physical exam.  Other tests, such as: ? Blood tests. ? An electrocardiogram (ECG). This test measures the electrical activity of the heart. ? Ambulatory cardiac monitor. This records your heartbeats for 24 hours or more. You may be referred to a heart specialist (cardiologist). How is this treated? Treatment for this condition depends on the cause or the underlying condition. Treatment may involve:  Treating the underlying condition.  Taking new medicines or changing your current medicines as told by your health care provider.  Making changes to your diet or lifestyle. Follow these instructions at home: Lifestyle  Do not use any products that contain nicotine or tobacco, such as cigarettes and e-cigarettes. If you need help quitting, ask your health care provider.  Do not use illegal drugs, such as  cocaine.  Learn relaxation methods to help you when you get stressed or anxious. These include deep breathing.  Avoid caffeine or other stimulants.   Alcohol use  Do not drink alcohol if: ? Your health care provider tells you not to drink. ? You are pregnant, may be pregnant, or are planning to become pregnant.  If you drink alcohol, limit how much you have: ? 0-1 drink a day for women. ? 0-2 drinks a day for men.  Be aware of how much alcohol is in your drink. In the [place] one drink equals one typical bottle of beer (12 oz), one-half glass of wine (5 oz), or one shot of hard liquor (1 oz).   General instructions  Drink enough fluids to keep your urine pale yellow.  Take over-the-counter and prescription medicines only as told by your health care provider.  Keep all follow-up visits as told by your health care provider. This is important. Contact a health care provider if you have:  A fever.  Vomiting or diarrhea that does not go away. Get help right away if you:  Have pain in your chest, upper arms, jaw, or neck.  Become weak or dizzy.  Feel faint.  Have palpitations that do not go away. Summary  In sinus tachycardia, the heart beats more than 100 times a minute.  Sinus tachycardia may be harmless, or it may be a sign of a serious condition.  Treatment for this condition depends on the cause or the underlying condition.  Get help right away if you have pain in your chest, upper arms, jaw, or neck. This information is not intended to replace advice given to   you by your health care provider. Make sure you discuss any questions you have with your health care provider. Document Revised: 11/17/2017 Document Reviewed: 11/17/2017 Elsevier Patient Education  2021 Elsevier Inc.  

## 2021-01-21 NOTE — Progress Notes (Signed)
I,[name],acting as a Neurosurgeon for [name], MD.,have documented all relevant documentation on the behalf of [name], MD,as directed by  [name], MD while in the presence of [name], MD.  This visit occurred during the SARS-CoV-2 public health emergency.  Safety protocols were in place, including screening questions prior to the visit, additional usage of staff PPE, and extensive cleaning of exam room while observing appropriate contact time as indicated for disinfecting solutions.  Subjective:     Patient ID: Cheryl Hill , female    DOB: 1976-07-08 , 45 y.o.   MRN: [id number]   Chief Complaint  Patient presents with  . Hypertension    HPI  The patient is here today for a blood pressure follow-up. She reports compliance with meds. She has brought in her home readings.  Hypertension This is a chronic problem. The current episode started more than 1 year ago. The problem has been gradually improving since onset. The problem is controlled. Pertinent negatives include no blurred vision, chest pain, palpitations or shortness of breath. Past treatments include angiotensin blockers. The current treatment provides moderate improvement.     Past Medical History:  Diagnosis Date  . Atopic dermatitis 11/10/2016  . Hypertension      Family History  Problem Relation Age of Onset  . Diabetes Mother   . Hypertension Mother   . Hyperlipidemia Mother   . Cataracts Mother   . Obesity Mother   . Hyperlipidemia Father   . Hypertension Father   . Prostate cancer Father   . Diabetes Father      Current Outpatient Medications:  .  etonogestrel (NEXPLANON) 68 MG IMPL implant, Inject into the skin., Disp: , Rfl:  .  losartan (COZAAR) 50 MG tablet, TAKE 1 TABLET BY MOUTH EVERY DAY, Disp: 90 tablet, Rfl: 2 .  MAGNESIUM GLYCINATE PO, Take by mouth. 350 mg daily, Disp: , Rfl:  .  metoprolol succinate (TOPROL XL) 25 MG 24 hr tablet, Take 1 tablet (25 mg  total) by mouth daily., Disp: 30 tablet, Rfl: 11 .  Multiple Vitamin (MULTIVITAMIN) tablet, Take 1 tablet by mouth daily., Disp: , Rfl:    No Known Allergies   Review of Systems  Constitutional: Negative.   Eyes: Negative for blurred vision.  Respiratory: Negative.  Negative for shortness of breath.   Cardiovascular: Negative.  Negative for chest pain and palpitations.  Gastrointestinal: Negative.   Psychiatric/Behavioral: Negative.   All other systems reviewed and are negative.    Today's Vitals   01/21/21 1150  BP: 126/80  Pulse: 67  Temp: 98.3 F (36.8 C)  TempSrc: Oral  Weight: 151 lb (68.5 kg)  Height: 5\' 5"  (1.651 m)   Body mass index is 25.13 kg/m.  Wt Readings from Last 3 Encounters:  01/21/21 151 lb (68.5 kg)  12/10/20 150 lb 12.8 oz (68.4 kg)  06/11/20 150 lb 6.4 oz (68.2 kg)   Objective:  Physical Exam Vitals and nursing note reviewed.  Constitutional:      Appearance: Normal appearance.  HENT:     Head: Normocephalic and atraumatic.     Nose:     Comments: Masked     Mouth/Throat:     Comments: Masked  Cardiovascular:     Rate and Rhythm: Normal rate and regular rhythm.     Heart sounds: Normal heart sounds.  Pulmonary:     Effort: Pulmonary effort is normal.     Breath sounds: Normal breath sounds.  Musculoskeletal:     Cervical back: Normal range of motion.  Skin:    General: Skin is warm.  Neurological:     General: No focal deficit present.     Mental Status: She is alert.  Psychiatric:        Mood and Affect: Mood normal.        Behavior: Behavior normal.         Assessment And Plan:     1. Essential hypertension, benign Comments: Controlled here at the office, but not at home.  I will increase losartan to 1.5 tabs daily for a total of 75mg  daily. She is encouraged to continue to monitor her BP readings at home and send weekly updates.   2. Tachycardia Comments: Encouraged to continue with Mg supplementation, increase water intake  and avoid caffeinated products.  She will c/w Toprol XL, 25mg  daily.  She may need higher dose if persistent. I will also refer her to Cardiology for further evaluation. Workup may include echo, monitor, etc.  - TSH - Magnesium  3. Encounter for other contraceptive management I will refer her to at [facility] to remove Nexplanon as requested.  - Ambulatory referral to Gynecology   Patient was given opportunity to ask questions. Patient verbalized understanding of the plan and was able to repeat key elements of the plan. All questions were answered to their satisfaction.   I, Marlinda Mike, MD, have reviewed all documentation for this visit. The documentation on 01/21/21 for the exam, diagnosis, procedures, and orders are all accurate and complete.   IF YOU HAVE BEEN REFERRED TO A SPECIALIST, IT MAY TAKE 1-2 WEEKS TO SCHEDULE/PROCESS THE REFERRAL. IF YOU HAVE NOT HEARD FROM US/SPECIALIST IN TWO WEEKS, PLEASE GIVE [name] A CALL AT [phone] X [phone].   THE PATIENT IS ENCOURAGED TO PRACTICE SOCIAL DISTANCING DUE TO THE COVID-19 PANDEMIC.

## 2021-01-22 LAB — MAGNESIUM: Magnesium: 2.2 mg/dL (ref 1.6–2.3)

## 2021-01-22 LAB — TSH: TSH: 0.522 u[IU]/mL (ref 0.450–4.500)

## 2021-01-24 LAB — T4, FREE: Free T4: 1.26 ng/dL (ref 0.82–1.77)

## 2021-01-24 LAB — SPECIMEN STATUS REPORT

## 2021-02-02 ENCOUNTER — Encounter: Payer: Self-pay | Admitting: Internal Medicine

## 2021-02-03 ENCOUNTER — Ambulatory Visit: Payer: 59 | Admitting: Endocrinology

## 2021-03-11 ENCOUNTER — Ambulatory Visit: Payer: BC Managed Care – PPO | Admitting: Internal Medicine

## 2021-03-11 ENCOUNTER — Encounter: Payer: Self-pay | Admitting: Internal Medicine

## 2021-03-11 ENCOUNTER — Other Ambulatory Visit: Payer: Self-pay

## 2021-03-11 VITALS — BP 124/78 | HR 69 | Temp 98.0°F | Ht 65.0 in | Wt 155.6 lb

## 2021-03-11 DIAGNOSIS — R000 Tachycardia, unspecified: Secondary | ICD-10-CM | POA: Diagnosis not present

## 2021-03-11 DIAGNOSIS — I10 Essential (primary) hypertension: Secondary | ICD-10-CM | POA: Diagnosis not present

## 2021-03-11 MED ORDER — LOSARTAN POTASSIUM 50 MG PO TABS: ORAL_TABLET | ORAL | 2 refills | Status: DC

## 2021-03-11 NOTE — Progress Notes (Signed)
I,Cheryl Hill,acting as a Education administrator for Cheryl Greenland, MD.,have documented all relevant documentation on the behalf of Cheryl Greenland, MD,as directed by  Cheryl Greenland, MD while in the presence of Cheryl Greenland, MD.  This visit occurred during the SARS-CoV-2 public health emergency.  Safety protocols were in place, including screening questions prior to the visit, additional usage of staff PPE, and extensive cleaning of exam room while observing appropriate contact time as indicated for disinfecting solutions.  Subjective:     Patient ID: Cheryl Hill , female    DOB: July 24, 1976 , 45 y.o.   MRN: [id number]   Chief Complaint  Patient presents with   Hypertension    HPI  The patient is here today for a blood pressure follow-up. She reports compliance with meds. States her BP have improved, she keeps daily log.   Hypertension This is a chronic problem. The current episode started more than 1 year ago. The problem has been gradually improving since onset. The problem is controlled. Pertinent negatives include no blurred vision, chest pain, palpitations or shortness of breath. Past treatments include angiotensin blockers. The current treatment provides moderate improvement.    Past Medical History:  Diagnosis Date   Atopic dermatitis 11/10/2016   Hypertension      Family History  Problem Relation Age of Onset   Diabetes Mother    Hypertension Mother    Hyperlipidemia Mother    Cataracts Mother    Obesity Mother    Hyperlipidemia Father    Hypertension Father    Prostate cancer Father    Diabetes Father      Current Outpatient Medications:    etonogestrel (NEXPLANON) 57 MG IMPL implant, Inject into the skin., Disp: , Rfl:    MAGNESIUM GLYCINATE PO, Take by mouth. 350 mg daily every other day, Disp: , Rfl:    metoprolol succinate (TOPROL XL) 25 MG 24 hr tablet, Take 1 tablet (25 mg total) by mouth daily., Disp: 30 tablet, Rfl: 11   Multiple Vitamin  (MULTIVITAMIN) tablet, Take 1 tablet by mouth daily., Disp: , Rfl:    losartan (COZAAR) 50 MG tablet, 1.5 daily, Disp: 135 tablet, Rfl: 2   No Known Allergies   Review of Systems  Constitutional: Negative.   Eyes:  Negative for blurred vision.  Respiratory: Negative.  Negative for shortness of breath.   Cardiovascular: Negative.  Negative for chest pain and palpitations.  Gastrointestinal: Negative.   Psychiatric/Behavioral: Negative.    All other systems reviewed and are negative.   Today's Vitals   03/11/21 0949  BP: 124/78  Pulse: 69  Temp: 98 F (36.7 C)  TempSrc: Oral  Weight: 155 lb 9.6 oz (70.6 kg)  Height: $Remove'5\' 5"'nKmChWb$  (1.651 m)   Body mass index is 25.89 kg/m.  Wt Readings from Last 3 Encounters:  03/11/21 155 lb 9.6 oz (70.6 kg)  01/21/21 151 lb (68.5 kg)  12/10/20 150 lb 12.8 oz (68.4 kg)   BP Readings from Last 3 Encounters:  03/11/21 124/78  01/21/21 126/80  12/10/20 122/88   Objective:  Physical Exam Vitals and nursing note reviewed.  Constitutional:      Appearance: Normal appearance.  HENT:     Head: Normocephalic and atraumatic.     Nose:     Comments: Masked     Mouth/Throat:     Comments: Masked  Cardiovascular:     Rate and Rhythm: Normal rate and regular rhythm.     Heart sounds: Normal heart sounds.  Pulmonary:     Effort: Pulmonary effort is normal.     Breath sounds: Normal breath sounds.  Skin:    General: Skin is warm.  Neurological:     General: No focal deficit present.     Mental Status: She is alert.  Psychiatric:        Mood and Affect: Mood normal.        Behavior: Behavior normal.        Assessment And Plan:     1. Essential hypertension, benign Comments: Chronic, she will c/w losartan $RemoveBefo'50mg'CVhcriAYUSL$  1.5 tabs daily.  Encouraged to keep upcoming appt with Cardiology.  I will check BMP today.  - BMP8+EGFR  2. Tachycardia Comments: Improved with use of Toprol XL $RemoveB'25mg'JumxszSA$  daily.    Patient was given opportunity to ask questions. Patient  verbalized understanding of the plan and was able to repeat key elements of the plan. All questions were answered to their satisfaction.   I, Cheryl Greenland, MD, have reviewed all documentation for this visit. The documentation on 03/23/21 for the exam, diagnosis, procedures, and orders are all accurate and complete.   IF YOU HAVE BEEN REFERRED TO A SPECIALIST, IT MAY TAKE 1-2 WEEKS TO SCHEDULE/PROCESS THE REFERRAL. IF YOU HAVE NOT HEARD FROM US/SPECIALIST IN TWO WEEKS, PLEASE GIVE [place] A CALL AT [phone].   THE PATIENT IS ENCOURAGED TO PRACTICE SOCIAL DISTANCING DUE TO THE COVID-19 PANDEMIC.

## 2021-03-11 NOTE — Patient Instructions (Signed)

## 2021-03-12 LAB — BMP8+EGFR
BUN/Creatinine Ratio: 16 (ref 9–23)
BUN: 18 mg/dL (ref 6–24)
CO2: 26 mmol/L (ref 20–29)
Calcium: 10.1 mg/dL (ref 8.7–10.2)
Chloride: 104 mmol/L (ref 96–106)
Creatinine, Ser: 1.13 mg/dL — ABNORMAL HIGH (ref 0.57–1.00)
Glucose: 64 mg/dL — ABNORMAL LOW (ref 65–99)
Potassium: 5 mmol/L (ref 3.5–5.2)
Sodium: 146 mmol/L — ABNORMAL HIGH (ref 134–144)
eGFR: 62 mL/min/{1.73_m2} (ref >59)

## 2021-03-17 ENCOUNTER — Telehealth: Payer: Self-pay | Admitting: Endocrinology

## 2021-03-17 DIAGNOSIS — E109 Type 1 diabetes mellitus without complications: Secondary | ICD-10-CM

## 2021-03-17 NOTE — Telephone Encounter (Signed)
Pt calling in stating that she needs a refill on her medication her app is 06/09/2021  Insulin Glargine [facility] KWIKPEN) 100 UNIT/ML  [id number] - [place], [place] - [address] AT [facility] OF [address]

## 2021-03-19 MED ORDER — BASAGLAR KWIKPEN 100 UNIT/ML ~~LOC~~ SOPN: 8.0000 [IU] | PEN_INJECTOR | Freq: Every day | SUBCUTANEOUS | 0 refills | Status: AC

## 2021-03-19 NOTE — Telephone Encounter (Signed)
Refill sent as requested. 

## 2021-05-06 ENCOUNTER — Encounter: Payer: Self-pay | Admitting: Internal Medicine

## 2021-05-06 ENCOUNTER — Ambulatory Visit: Payer: BC Managed Care – PPO | Admitting: Internal Medicine

## 2021-05-06 ENCOUNTER — Other Ambulatory Visit: Payer: Self-pay

## 2021-05-06 VITALS — BP 132/82 | HR 82 | Resp 18 | Ht 65.0 in | Wt 157.0 lb

## 2021-05-06 DIAGNOSIS — R000 Tachycardia, unspecified: Secondary | ICD-10-CM | POA: Diagnosis not present

## 2021-05-06 DIAGNOSIS — I10 Essential (primary) hypertension: Secondary | ICD-10-CM

## 2021-05-06 NOTE — Patient Instructions (Signed)
Medication Instructions:  No Changes In Medications at this time.  *If you need a refill on your cardiac medications before your next appointment, please call your pharmacy*  Testing/Procedures: Your physician has requested that you have an echocardiogram. Echocardiography is a painless test that uses sound waves to create images of your heart. It provides your doctor with information about the size and shape of your heart and how well your heart's chambers and valves are working. You may receive an ultrasound enhancing agent through an IV if needed to better visualize your heart during the echo.This procedure takes approximately one hour. There are no restrictions for this procedure. This will take place at the [address].   Follow-Up: At [facility], you and your health needs are our priority.  As part of our continuing mission to provide you with exceptional heart care, we have created designated Provider Care Teams.  These Care Teams include your primary Cardiologist (physician) and Advanced Practice Providers (APPs -  Physician Assistants and Nurse Practitioners) who all work together to provide you with the care you need, when you need it.  Your next appointment:   Right after Echo  The format for your next appointment:   In Person  Provider:   Weston Brass, MD

## 2021-05-06 NOTE — Progress Notes (Signed)
Cardiology Office Note:    Date:  05/06/2021   ID:  Cheryl Hill, DOB Sep 19, 1976, MRN [id number]  PCP:  Dorothyann Peng, MD  Cardiologist:  None  Electrophysiologist:  None   Referring MD: Dorothyann Peng, MD   Chief Complaint/Reason for Referral: Tachycardia  History of Present Illness:    Cheryl Hill is a 45 y.o. female with a history of HTN.  Here for tachycardia.  Low 100s bpm sitting and resting, lying down decreases HR. Unaware of resting tachycardia. Occasional palpitation. Can hear her heart beats.   Was drinking caffeine but stopped over a year ago- no impact on tachycardia.   2 children - son 53, daughter 12.  HTN in pregnancy - induced labor, GDM.   FHX: Both parents HTN. Uncles with Mis in 60s.  Maternal GF - fatal MI approx 50s.   The patient denies chest pain, chest pressure, dyspnea at rest or with exertion, PND, orthopnea, or leg swelling. Denies cough, fever, chills. Denies nausea, vomiting. Denies syncope or presyncope. Denies dizziness or lightheadedness.  Past Medical History:  Diagnosis Date   Atopic dermatitis 11/10/2016   Hypertension     Past Surgical History:  Procedure Laterality Date   OOPHORECTOMY N/A 08/1993    Current Medications: Current Meds  Medication Sig   etonogestrel (NEXPLANON) 68 MG IMPL implant Inject into the skin.   losartan (COZAAR) 50 MG tablet 1.5 daily   MAGNESIUM GLYCINATE PO Take by mouth. 350 mg daily every other day   metoprolol succinate (TOPROL XL) 25 MG 24 hr tablet Take 1 tablet (25 mg total) by mouth daily.   Multiple Vitamin (MULTIVITAMIN) tablet Take 1 tablet by mouth daily.     Allergies:   Patient has no known allergies.   Social History   Tobacco Use   Smoking status: Never   Smokeless tobacco: Never  Vaping Use   Vaping Use: Never used  Substance Use Topics   Alcohol use: Never   Drug use: No     Family History: The patient's family history includes Cataracts in her  mother; Diabetes in her father and mother; Hyperlipidemia in her father and mother; Hypertension in her father and mother; Obesity in her mother; Prostate cancer in her father.  ROS:   Please see the history of present illness.    All other systems reviewed and are negative.  EKGs/Labs/Other Studies Reviewed:    The following studies were reviewed today:  EKG:  NSR rate 82   Recent Labs: 06/11/2020: Hemoglobin 15.0; Platelets 284 12/10/2020: ALT 10 01/21/2021: Magnesium 2.2; TSH 0.522 03/11/2021: BUN 18; Creatinine, Ser 1.13; Potassium 5.0; Sodium 146  Recent Lipid Panel    Component Value Date/Time   CHOL 149 06/11/2020 1729   TRIG 70 06/11/2020 1729   HDL 46 06/11/2020 1729   CHOLHDL 3.2 06/11/2020 1729   LDLCALC 89 06/11/2020 1729    Physical Exam:    VS:  BP 132/82   Pulse 82   Resp 18   Ht 5\' 5"  (1.651 m)   Wt 157 lb (71.2 kg)   SpO2 96%   BMI 26.13 kg/m     Wt Readings from Last 5 Encounters:  05/06/21 157 lb (71.2 kg)  03/11/21 155 lb 9.6 oz (70.6 kg)  01/21/21 151 lb (68.5 kg)  12/10/20 150 lb 12.8 oz (68.4 kg)  06/11/20 150 lb 6.4 oz (68.2 kg)    Constitutional: No acute distress Eyes: sclera non-icteric, normal conjunctiva and lids ENMT: normal dentition, moist mucous  membranes Cardiovascular: regular rhythm, normal rate, no murmurs. S1 and S2 normal. Radial pulses normal bilaterally. No jugular venous distention.  Respiratory: clear to auscultation bilaterally GI : normal bowel sounds, soft and nontender. No distention.   MSK: extremities warm, well perfused. No edema.  NEURO: grossly nonfocal exam, moves all extremities. PSYCH: alert and oriented x 3, normal mood and affect.   ASSESSMENT:    1. Essential hypertension, benign   2. Tachycardia    PLAN:    Essential hypertension, benign - Plan: EKG 12-Lead, ECHOCARDIOGRAM COMPLETE  Tachycardia - Plan: EKG 12-Lead, ECHOCARDIOGRAM COMPLETE  She is largely asymptomatic but has had slightly higher  heart rate readings more recently.  These are relatively well controlled with metoprolol but she has had persistent tachycardia and therefore cardiology consult was requested.  No concern for structural or congenital heart disease by exam, however we will obtain an echocardiogram to ensure there is no structural heart disease. She also has a history of hypertension, would be best to evaluate this with echocardiogram to evaluate for any LVH given slightly higher voltages on ECG that do not yet meet criteria for LVH.  No change to medication therapy at this time, we will reevaluate medication therapy after echocardiogram results are completed.  Weston Brass, MD, [facility]  [facility]   Medication Adjustments/Labs and Tests Ordered: Current medicines are reviewed at length with the patient today.  Concerns regarding medicines are outlined above.   Orders Placed This Encounter  Procedures   EKG 12-Lead   ECHOCARDIOGRAM COMPLETE    No orders of the defined types were placed in this encounter.   Patient Instructions  Medication Instructions:  No Changes In Medications at this time.  *If you need a refill on your cardiac medications before your next appointment, please call your pharmacy*  Testing/Procedures: Your physician has requested that you have an echocardiogram. Echocardiography is a painless test that uses sound waves to create images of your heart. It provides your doctor with information about the size and shape of your heart and how well your heart's chambers and valves are working. You may receive an ultrasound enhancing agent through an IV if needed to better visualize your heart during the echo.This procedure takes approximately one hour. There are no restrictions for this procedure. This will take place at the [address].   Follow-Up: At [facility], you and your health needs are our priority.  As part of our continuing mission to provide you  with exceptional heart care, we have created designated Provider Care Teams.  These Care Teams include your primary Cardiologist (physician) and Advanced Practice Providers (APPs -  Physician Assistants and Nurse Practitioners) who all work together to provide you with the care you need, when you need it.  Your next appointment:   Right after Echo  The format for your next appointment:   In Person  Provider:   Weston Brass, MD

## 2021-05-21 ENCOUNTER — Encounter: Payer: Self-pay | Admitting: Internal Medicine

## 2021-05-26 ENCOUNTER — Ambulatory Visit (HOSPITAL_COMMUNITY): Payer: BC Managed Care – PPO | Attending: Internal Medicine

## 2021-05-26 ENCOUNTER — Other Ambulatory Visit: Payer: Self-pay

## 2021-05-26 DIAGNOSIS — I10 Essential (primary) hypertension: Secondary | ICD-10-CM | POA: Insufficient documentation

## 2021-05-26 DIAGNOSIS — R000 Tachycardia, unspecified: Secondary | ICD-10-CM | POA: Diagnosis not present

## 2021-05-26 LAB — ECHOCARDIOGRAM COMPLETE
Area-P 1/2: 3.23 cm2
S' Lateral: 2.7 cm

## 2021-06-09 ENCOUNTER — Ambulatory Visit: Payer: 59 | Admitting: Endocrinology

## 2021-06-13 ENCOUNTER — Ambulatory Visit: Payer: BC Managed Care – PPO | Admitting: Internal Medicine

## 2021-06-17 ENCOUNTER — Other Ambulatory Visit: Payer: Self-pay

## 2021-06-17 ENCOUNTER — Encounter: Payer: Self-pay | Admitting: Internal Medicine

## 2021-06-17 ENCOUNTER — Ambulatory Visit: Payer: BC Managed Care – PPO | Admitting: Internal Medicine

## 2021-06-17 VITALS — BP 130/98 | HR 90 | Ht 66.0 in | Wt 157.6 lb

## 2021-06-17 DIAGNOSIS — R000 Tachycardia, unspecified: Secondary | ICD-10-CM | POA: Diagnosis not present

## 2021-06-17 DIAGNOSIS — Z79899 Other long term (current) drug therapy: Secondary | ICD-10-CM | POA: Diagnosis not present

## 2021-06-17 DIAGNOSIS — I10 Essential (primary) hypertension: Secondary | ICD-10-CM

## 2021-06-17 MED ORDER — AMLODIPINE BESYLATE 5 MG PO TABS: 5.0000 mg | ORAL_TABLET | Freq: Every day | ORAL | 3 refills | Status: DC

## 2021-06-17 NOTE — Progress Notes (Signed)
Cardiology Office Note:    Date:  06/17/2021   ID:  Cheryl Hill, DOB 12-17-1975, MRN [id number]  PCP:  Dorothyann Peng, MD  Cardiologist:  None  Electrophysiologist:  None   Referring MD: Dorothyann Peng, MD   Chief Complaint/Reason for Referral: Tachycardia, hypertension follow-up  History of Present Illness:    Cheryl Hill is a 45 y.o. female with a history of HTN.  Initially presented for for tachycardia.  We reviewed normal echocardiogram results with no worrisome features to suggest an etiology for tachycardia.  She feels relieved by this.  Blood pressure elevated in particular diastolic blood pressure is above goal.  She takes losartan 75 mg a day as well as metoprolol succinate 25 mg a day.  She finds it difficult to get her diastolic blood pressure to go down and is rarely in the 80s.  We discussed goal blood pressure at her young healthy age of at least 120/80 if not less.  She feels that with on the job stress it has been elevated in the recent past and perhaps for a few years.  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  The patient denies chest pain, chest pressure, dyspnea at rest or with exertion, PND, orthopnea, or leg swelling. Denies cough, fever, chills. Denies nausea, vomiting. Denies syncope or presyncope. Denies dizziness or lightheadedness. Denies snoring.   Past Medical History:  Diagnosis Date   Atopic dermatitis 11/10/2016   Hypertension     Past Surgical History:  Procedure Laterality Date   OOPHORECTOMY N/A 08/1993    Current Medications: Current Meds  Medication Sig   amLODipine (NORVASC) 5 MG tablet Take 1 tablet (5 mg total) by mouth daily.   etonogestrel (NEXPLANON) 68 MG IMPL implant Inject into the skin.   losartan (COZAAR) 50 MG tablet 1.5 daily (Patient taking differently: 1.5 (75 MG) daily)   MAGNESIUM GLYCINATE PO Take by mouth. 350 mg daily every other day   metoprolol  succinate (TOPROL XL) 25 MG 24 hr tablet Take 1 tablet (25 mg total) by mouth daily.   Multiple Vitamin (MULTIVITAMIN) tablet Take 1 tablet by mouth daily.     Allergies:   Patient has no known allergies.   Social History   Tobacco Use   Smoking status: Never   Smokeless tobacco: Never  Vaping Use   Vaping Use: Never used  Substance Use Topics   Alcohol use: Never   Drug use: No     Family History: The patient's family history includes Cataracts in her mother; Diabetes in her father and mother; Hyperlipidemia in her father and mother; Hypertension in her father and mother; Obesity in her mother; Prostate cancer in her father.  ROS:   Please see the history of present illness.    All other systems reviewed and are negative.  EKGs/Labs/Other Studies Reviewed:    The following studies were reviewed today:  EKG: Not performed today 05/06/21: NSR rate 82  Recent Labs: 12/10/2020: ALT 10 01/21/2021: Magnesium 2.2; TSH 0.522 03/11/2021: BUN 18; Creatinine, Ser 1.13; Potassium 5.0; Sodium 146  Recent Lipid Panel    Component Value Date/Time   CHOL 149 06/11/2020 1729   TRIG 70 06/11/2020 1729   HDL 46 06/11/2020 1729   CHOLHDL 3.2 06/11/2020 1729   LDLCALC 89 06/11/2020 1729    Physical Exam:    VS:  BP (!) 130/98 (BP Location: Left Arm)   Pulse 90   Ht 5\' 6"  (  1.676 m)   Wt 157 lb 9.6 oz (71.5 kg)   SpO2 99%   BMI 25.44 kg/m     Wt Readings from Last 5 Encounters:  06/17/21 157 lb 9.6 oz (71.5 kg)  05/06/21 157 lb (71.2 kg)  03/11/21 155 lb 9.6 oz (70.6 kg)  01/21/21 151 lb (68.5 kg)  12/10/20 150 lb 12.8 oz (68.4 kg)    Constitutional: No acute distress Eyes: sclera non-icteric, normal conjunctiva and lids ENMT: normal dentition, moist mucous membranes Cardiovascular: regular rhythm, normal rate, no murmurs. S1 and S2 normal. Radial pulses normal bilaterally. No jugular venous distention.  Respiratory: clear to auscultation bilaterally GI : normal bowel sounds,  soft and nontender. No distention.   MSK: extremities warm, well perfused. No edema.  NEURO: grossly nonfocal exam, moves all extremities. PSYCH: alert and oriented x 3, normal mood and affect.    ASSESSMENT:    1. Primary hypertension   2. Tachycardia   3. Medication management     PLAN:    Primary hypertension-in addition to losartan we will 75 mg a day metoprolol succinate 25 mg a day, we have discussed and shared decision making starting amlodipine 5 mg a day for better blood pressure control.  Despite the fact that she feels her blood pressure is situationally elevated, it is like this most of the days of the week and we discussed aggressive preventive strategies to avoid cardiovascular disease.  If she does well on amlodipine 5 mg daily hopefully this will be the therapy she needs to bring the diastolic blood pressure into her goal range.  Tachycardia-overall stable, heart rate 90 today, no worrisome features on ECG or echocardiogram.  Can continue metoprolol, though this is primarily for blood pressure control.  Medication management-as above.   Total time of encounter: 30 minutes total time of encounter, including 20 minutes spent in face-to-face patient care on the date of this encounter. This time includes coordination of care and counseling regarding above mentioned problem list. Remainder of non-face-to-face time involved reviewing chart documents/testing relevant to the patient encounter and documentation in the medical record. I have independently reviewed documentation from referring provider.   Weston Brass, MD, [facility] [place]  [facility]    Medication Adjustments/Labs and Tests Ordered: Current medicines are reviewed at length with the patient today.  Concerns regarding medicines are outlined above.   No orders of the defined types were placed in this encounter.   Meds ordered this encounter  Medications   amLODipine (NORVASC) 5 MG tablet    Sig: Take  1 tablet (5 mg total) by mouth daily.    Dispense:  90 tablet    Refill:  3     Patient Instructions  Medication Instructions:  START: AMLODIPINE 5mg  DAILY *If you need a refill on your cardiac medications before your next appointment, please call your pharmacy*  Follow-Up: At [facility], you and your health needs are our priority.  As part of our continuing mission to provide you with exceptional heart care, we have created designated Provider Care Teams.  These Care Teams include your primary Cardiologist (physician) and Advanced Practice Providers (APPs -  Physician Assistants and Nurse Practitioners) who all work together to provide you with the care you need, when you need it.  Your next appointment:   MARCH 30th at 2pm  The format for your next appointment:   In Person  Provider:   April 01, MD

## 2021-06-17 NOTE — Patient Instructions (Signed)
Medication Instructions:  START: AMLODIPINE 5mg  DAILY *If you need a refill on your cardiac medications before your next appointment, please call your pharmacy*  Follow-Up: At [facility], you and your health needs are our priority.  As part of our continuing mission to provide you with exceptional heart care, we have created designated Provider Care Teams.  These Care Teams include your primary Cardiologist (physician) and Advanced Practice Providers (APPs -  Physician Assistants and Nurse Practitioners) who all work together to provide you with the care you need, when you need it.  Your next appointment:   MARCH 30th at 2pm  The format for your next appointment:   In Person  Provider:   April 01, MD

## 2021-06-19 ENCOUNTER — Other Ambulatory Visit: Payer: Self-pay

## 2021-06-19 ENCOUNTER — Encounter: Payer: Self-pay | Admitting: Internal Medicine

## 2021-06-19 ENCOUNTER — Ambulatory Visit (INDEPENDENT_AMBULATORY_CARE_PROVIDER_SITE_OTHER): Payer: BC Managed Care – PPO | Admitting: Internal Medicine

## 2021-06-19 VITALS — BP 110/68 | HR 92 | Temp 98.0°F | Ht 66.0 in | Wt 157.2 lb

## 2021-06-19 DIAGNOSIS — Z1231 Encounter for screening mammogram for malignant neoplasm of breast: Secondary | ICD-10-CM | POA: Diagnosis not present

## 2021-06-19 DIAGNOSIS — Z0000 Encounter for general adult medical examination without abnormal findings: Secondary | ICD-10-CM | POA: Diagnosis not present

## 2021-06-19 DIAGNOSIS — Z23 Encounter for immunization: Secondary | ICD-10-CM

## 2021-06-19 DIAGNOSIS — I10 Essential (primary) hypertension: Secondary | ICD-10-CM

## 2021-06-19 DIAGNOSIS — Z1211 Encounter for screening for malignant neoplasm of colon: Secondary | ICD-10-CM

## 2021-06-19 LAB — POCT URINALYSIS DIPSTICK
Bilirubin, UA: NEGATIVE
Blood, UA: NEGATIVE
Glucose, UA: NEGATIVE
Ketones, UA: NEGATIVE
Nitrite, UA: NEGATIVE
Protein, UA: NEGATIVE
Spec Grav, UA: 1.02 (ref 1.010–1.025)
Urobilinogen, UA: 0.2 E.U./dL
pH, UA: 7.5 (ref 5.0–8.0)

## 2021-06-19 NOTE — Progress Notes (Signed)
Cheryl Hill,acting as a Education administrator for Cheryl Greenland, MD.,have documented all relevant documentation on the behalf of Cheryl Greenland, MD,as directed by  Cheryl Greenland, MD while in the presence of Cheryl Greenland, MD.  This visit occurred during the SARS-CoV-2 public health emergency.  Safety protocols were in place, including screening questions prior to the visit, additional usage of staff PPE, and extensive cleaning of exam room while observing appropriate contact time as indicated for disinfecting solutions.  Subjective:     Patient ID: Cheryl Hill , female    DOB: 1976/03/07 , 45 y.o.   MRN: [id number]   Chief Complaint  Patient presents with   Annual Exam   Hypertension    HPI  She is here today for a full physical examination. She is followed by for her Artelia Laroche, midwife for her GYN exams.  Last pap was 01/2020. She has no specific concerns or complaints at this time. Since her last visit, she has been evaluated by Cardiology.  Amlodipine has been added to her BP regimen. She has not had any issues with the medication.   Hypertension This is a chronic problem. The current episode started more than 1 year ago. The problem has been gradually improving since onset. The problem is controlled. Pertinent negatives include no blurred vision, chest pain, palpitations or shortness of breath. Past treatments include angiotensin blockers. The current treatment provides moderate improvement.    Past Medical History:  Diagnosis Date   Atopic dermatitis 11/10/2016   Hypertension      Family History  Problem Relation Age of Onset   Diabetes Mother    Hypertension Mother    Hyperlipidemia Mother    Cataracts Mother    Obesity Mother    Hyperlipidemia Father    Hypertension Father    Prostate cancer Father    Diabetes Father      Current Outpatient Medications:    amLODipine (NORVASC) 5 MG tablet, Take 1 tablet (5 mg total) by mouth daily., Disp: 90 tablet, Rfl:  3   etonogestrel (NEXPLANON) 68 MG IMPL implant, Inject into the skin., Disp: , Rfl:    losartan (COZAAR) 50 MG tablet, 1.5 daily (Patient taking differently: 1.5 (75 MG) daily), Disp: 135 tablet, Rfl: 2   MAGNESIUM GLYCINATE PO, Take by mouth. 350 mg daily every other day, Disp: , Rfl:    metoprolol succinate (TOPROL XL) 25 MG 24 hr tablet, Take 1 tablet (25 mg total) by mouth daily., Disp: 30 tablet, Rfl: 11   Multiple Vitamin (MULTIVITAMIN) tablet, Take 1 tablet by mouth daily., Disp: , Rfl:    No Known Allergies    The patient states she uses Nexplanon for birth control. Last LMP was Patient's last menstrual period was 06/12/2021.. Negative for Dysmenorrhea. Negative for: breast discharge, breast lump(s), breast pain and breast self exam. Associated symptoms include abnormal vaginal bleeding. Pertinent negatives include abnormal bleeding (hematology), anxiety, decreased libido, depression, difficulty falling sleep, dyspareunia, history of infertility, nocturia, sexual dysfunction, sleep disturbances, urinary incontinence, urinary urgency, vaginal discharge and vaginal itching. Diet regular.The patient states her exercise level is  intermittent.   . The patient's tobacco use is:  Social History   Tobacco Use  Smoking Status Never  Smokeless Tobacco Never  . She has been exposed to passive smoke. The patient's alcohol use is:  Social History   Substance and Sexual Activity  Alcohol Use Never  c Review of Systems  Constitutional: Negative.   HENT: Negative.  Eyes: Negative.  Negative for blurred vision.  Respiratory: Negative.  Negative for shortness of breath.   Cardiovascular: Negative.  Negative for chest pain and palpitations.  Gastrointestinal: Negative.   Endocrine: Negative.   Genitourinary: Negative.   Musculoskeletal: Negative.   Skin: Negative.   Allergic/Immunologic: Negative.   Neurological: Negative.   Hematological: Negative.   Psychiatric/Behavioral: Negative.       Today's Vitals   06/19/21 0951  BP: 110/68  Pulse: 92  Temp: 98 F (36.7 C)  TempSrc: Oral  Weight: 157 lb 3.2 oz (71.3 kg)  Height: _0  (1.676 m)   Body mass index is 25.37 kg/m.  Wt Readings from Last 3 Encounters:  06/19/21 157 lb 3.2 oz (71.3 kg)  06/17/21 157 lb 9.6 oz (71.5 kg)  05/06/21 157 lb (71.2 kg)    BP Readings from Last 3 Encounters:  06/19/21 110/68  06/17/21 (!) 130/98  05/06/21 132/82    Objective:  Physical Exam Vitals and nursing note reviewed.  Constitutional:      Appearance: Normal appearance.  HENT:     Head: Normocephalic and atraumatic.     Right Ear: Tympanic membrane, ear canal and external ear normal.     Left Ear: Tympanic membrane, ear canal and external ear normal.     Nose:     Comments: Masked     Mouth/Throat:     Comments: Masked  Eyes:     Extraocular Movements: Extraocular movements intact.     Conjunctiva/sclera: Conjunctivae normal.     Pupils: Pupils are equal, round, and reactive to light.  Cardiovascular:     Rate and Rhythm: Normal rate and regular rhythm.     Pulses: Normal pulses.     Heart sounds: Normal heart sounds.  Pulmonary:     Effort: Pulmonary effort is normal.     Breath sounds: Normal breath sounds.  Chest:  Breasts:    Tanner Score is 5.     Right: Normal.     Left: Normal.  Abdominal:     General: Abdomen is flat. Bowel sounds are normal.     Palpations: Abdomen is soft.  Genitourinary:    Comments: deferred Musculoskeletal:        General: Normal range of motion.     Cervical back: Normal range of motion and neck supple.  Skin:    General: Skin is warm and dry.  Neurological:     General: No focal deficit present.     Mental Status: She is alert and oriented to person, place, and time.  Psychiatric:        Mood and Affect: Mood normal.        Behavior: Behavior normal.        Assessment And Plan:     1. Routine general medical examination at health care facility Comments: A full  exam was performed. Importance of monthly self breast exams was d/w patient.  I will refer her to GI for CRC screening. I will also refer to GYN as requested. PATIENT IS ADVISED TO GET 30-45 MINUTES REGULAR EXERCISE NO LESS THAN FOUR TO FIVE DAYS PER WEEK - BOTH WEIGHTBEARING EXERCISES AND AEROBIC ARE RECOMMENDED.  PATIENT IS ADVISED TO FOLLOW A HEALTHY DIET WITH AT LEAST SIX FRUITS/VEGGIES PER DAY, DECREASE INTAKE OF RED MEAT, AND TO INCREASE FISH INTAKE TO TWO DAYS PER WEEK.  MEATS/FISH SHOULD NOT BE FRIED, BAKED OR BROILED IS PREFERABLE.  IT IS ALSO IMPORTANT TO CUT BACK ON YOUR SUGAR INTAKE. PLEASE  AVOID ANYTHING WITH ADDED SUGAR, CORN SYRUP OR OTHER SWEETENERS. IF YOU MUST USE A SWEETENER, YOU CAN TRY STEVIA. IT IS ALSO IMPORTANT TO AVOID ARTIFICIALLY SWEETENERS AND DIET BEVERAGES. LASTLY, I SUGGEST WEARING SPF 50 SUNSCREEN ON EXPOSED PARTS AND ESPECIALLY WHEN IN THE DIRECT SUNLIGHT FOR AN EXTENDED PERIOD OF TIME.  PLEASE AVOID FAST FOOD RESTAURANTS AND INCREASE YOUR WATER INTAKE.  - CBC - CMP14+EGFR - Lipid panel - Ambulatory referral to Gastroenterology - Ambulatory referral to Obstetrics / Gynecology  2. Essential hypertension, benign Comments: Chronic, well controlled. EKG not performed, July 2022 reading reviewed in full detail.  Advised to follow low sodium diet. She will f/u in six months for re-evaluation. We discussed possibility of switching from losartan to valsartan if needed. Advised to take amlodipine nightly. If she experiences lightheadedness/dizziness, we discussed decreasing amlodipine to 1/2 tab nightly.  - POCT Urinalysis Dipstick (81002) - Microalbumin, urine  3. Screen for colon cancer Comments: I will refer her to GI for CRC screening.  - Ambulatory referral to Gastroenterology  4. Encounter for screening mammogram for malignant neoplasm of breast Comments: I will refer her to [facility] for mammo. Encouraged to perform monthly self breast exams was discussed with the patient.   - MM DIGITAL SCREENING BILATERAL; Future  Patient was given opportunity to ask questions. Patient verbalized understanding of the plan and was able to repeat key elements of the plan. All questions were answered to their satisfaction.   I, Cheryl Greenland, MD, have reviewed all documentation for this visit. The documentation on 06/21/21 for the exam, diagnosis, procedures, and orders are all accurate and complete.  THE PATIENT IS ENCOURAGED TO PRACTICE SOCIAL DISTANCING DUE TO THE COVID-19 PANDEMIC.

## 2021-06-19 NOTE — Patient Instructions (Signed)
Health Maintenance, Female Adopting a healthy lifestyle and getting preventive care are important in promoting health and wellness. Ask your health care provider about: The right schedule for you to have regular tests and exams. Things you can do on your own to prevent diseases and keep yourself healthy. What should I know about diet, weight, and exercise? Eat a healthy diet  Eat a diet that includes plenty of vegetables, fruits, low-fat dairy products, and lean protein. Do not eat a lot of foods that are high in solid fats, added sugars, or sodium. Maintain a healthy weight Body mass index (BMI) is used to identify weight problems. It estimates body fat based on height and weight. Your health care provider can help determine your BMI and help you achieve or maintain a healthy weight. Get regular exercise Get regular exercise. This is one of the most important things you can do for your health. Most adults should: Exercise for at least 150 minutes each week. The exercise should increase your heart rate and make you sweat (moderate-intensity exercise). Do strengthening exercises at least twice a week. This is in addition to the moderate-intensity exercise. Spend less time sitting. Even light physical activity can be beneficial. Watch cholesterol and blood lipids Have your blood tested for lipids and cholesterol at 45 years of age, then have this test every 5 years. Have your cholesterol levels checked more often if: Your lipid or cholesterol levels are high. You are older than 45 years of age. You are at high risk for heart disease. What should I know about cancer screening? Depending on your health history and family history, you may need to have cancer screening at various ages. This may include screening for: Breast cancer. Cervical cancer. Colorectal cancer. Skin cancer. Lung cancer. What should I know about heart disease, diabetes, and high blood pressure? Blood pressure and heart  disease High blood pressure causes heart disease and increases the risk of stroke. This is more likely to develop in people who have high blood pressure readings, are of African descent, or are overweight. Have your blood pressure checked: Every 3-5 years if you are 18-39 years of age. Every year if you are 40 years old or older. Diabetes Have regular diabetes screenings. This checks your fasting blood sugar level. Have the screening done: Once every three years after age 40 if you are at a normal weight and have a low risk for diabetes. More often and at a younger age if you are overweight or have a high risk for diabetes. What should I know about preventing infection? Hepatitis B If you have a higher risk for hepatitis B, you should be screened for this virus. Talk with your health care provider to find out if you are at risk for hepatitis B infection. Hepatitis C Testing is recommended for: Everyone born from 1945 through 1965. Anyone with known risk factors for hepatitis C. Sexually transmitted infections (STIs) Get screened for STIs, including gonorrhea and chlamydia, if: You are sexually active and are younger than 45 years of age. You are older than 45 years of age and your health care provider tells you that you are at risk for this type of infection. Your sexual activity has changed since you were last screened, and you are at increased risk for chlamydia or gonorrhea. Ask your health care provider if you are at risk. Ask your health care provider about whether you are at high risk for HIV. Your health care provider may recommend a prescription medicine   to help prevent HIV infection. If you choose to take medicine to prevent HIV, you should first get tested for HIV. You should then be tested every 3 months for as long as you are taking the medicine. Pregnancy If you are about to stop having your period (premenopausal) and you may become pregnant, seek counseling before you get  pregnant. Take 400 to 800 micrograms (mcg) of folic acid every day if you become pregnant. Ask for birth control (contraception) if you want to prevent pregnancy. Osteoporosis and menopause Osteoporosis is a disease in which the bones lose minerals and strength with aging. This can result in bone fractures. If you are 65 years old or older, or if you are at risk for osteoporosis and fractures, ask your health care provider if you should: Be screened for bone loss. Take a calcium or vitamin D supplement to lower your risk of fractures. Be given hormone replacement therapy (HRT) to treat symptoms of menopause. Follow these instructions at home: Lifestyle Do not use any products that contain nicotine or tobacco, such as cigarettes, e-cigarettes, and chewing tobacco. If you need help quitting, ask your health care provider. Do not use street drugs. Do not share needles. Ask your health care provider for help if you need support or information about quitting drugs. Alcohol use Do not drink alcohol if: Your health care provider tells you not to drink. You are pregnant, may be pregnant, or are planning to become pregnant. If you drink alcohol: Limit how much you use to 0-1 drink a day. Limit intake if you are breastfeeding. Be aware of how much alcohol is in your drink. In the [place] one drink equals one 12 oz bottle of beer (355 mL), one 5 oz glass of wine (148 mL), or one 1 oz glass of hard liquor (44 mL). General instructions Schedule regular health, dental, and eye exams. Stay current with your vaccines. Tell your health care provider if: You often feel depressed. You have ever been abused or do not feel safe at home. Summary Adopting a healthy lifestyle and getting preventive care are important in promoting health and wellness. Follow your health care provider's instructions about healthy diet, exercising, and getting tested or screened for diseases. Follow your health care provider's  instructions on monitoring your cholesterol and blood pressure. This information is not intended to replace advice given to you by your health care provider. Make sure you discuss any questions you have with your health care provider. Document Revised: 12/06/2020 Document Reviewed: 09/21/2018 Elsevier Patient Education  2022 Elsevier Inc.  

## 2021-06-20 ENCOUNTER — Other Ambulatory Visit (INDEPENDENT_AMBULATORY_CARE_PROVIDER_SITE_OTHER): Payer: Self-pay

## 2021-06-20 DIAGNOSIS — Z1211 Encounter for screening for malignant neoplasm of colon: Secondary | ICD-10-CM

## 2021-06-20 LAB — CBC
Hematocrit: 45.2 % (ref 34.0–46.6)
Hemoglobin: 14.9 g/dL (ref 11.1–15.9)
MCH: 26.4 pg — ABNORMAL LOW (ref 26.6–33.0)
MCHC: 33 g/dL (ref 31.5–35.7)
MCV: 80 fL (ref 79–97)
Platelets: 299 10*3/uL (ref 150–450)
RBC: 5.64 x10E6/uL — ABNORMAL HIGH (ref 3.77–5.28)
RDW: 13.2 % (ref 11.7–15.4)
WBC: 8.3 10*3/uL (ref 3.4–10.8)

## 2021-06-20 LAB — CMP14+EGFR
ALT: 18 IU/L (ref 0–32)
AST: 19 IU/L (ref 0–40)
Albumin/Globulin Ratio: 1.8 (ref 1.2–2.2)
Albumin: 4.8 g/dL (ref 3.8–4.8)
Alkaline Phosphatase: 45 IU/L (ref 44–121)
BUN/Creatinine Ratio: 13 (ref 9–23)
BUN: 12 mg/dL (ref 6–24)
Bilirubin Total: 0.3 mg/dL (ref 0.0–1.2)
CO2: 25 mmol/L (ref 20–29)
Calcium: 10.4 mg/dL — ABNORMAL HIGH (ref 8.7–10.2)
Chloride: 99 mmol/L (ref 96–106)
Creatinine, Ser: 0.89 mg/dL (ref 0.57–1.00)
Globulin, Total: 2.7 g/dL (ref 1.5–4.5)
Glucose: 69 mg/dL (ref 65–99)
Potassium: 4.5 mmol/L (ref 3.5–5.2)
Sodium: 140 mmol/L (ref 134–144)
Total Protein: 7.5 g/dL (ref 6.0–8.5)
eGFR: 81 mL/min/{1.73_m2} (ref >59)

## 2021-06-20 LAB — LIPID PANEL
Chol/HDL Ratio: 3.8 ratio (ref 0.0–4.4)
Cholesterol, Total: 179 mg/dL (ref 100–199)
HDL: 47 mg/dL (ref >39)
LDL Chol Calc (NIH): 120 mg/dL — ABNORMAL HIGH (ref 0–99)
Triglycerides: 65 mg/dL (ref 0–149)
VLDL Cholesterol Cal: 12 mg/dL (ref 5–40)

## 2021-06-20 LAB — MICROALBUMIN, URINE: Microalbumin, Urine: <3 ug/mL

## 2021-06-20 MED ORDER — NA SULFATE-K SULFATE-MG SULF 17.5-3.13-1.6 GM/177ML PO SOLN: 1.0000 | Freq: Once | ORAL | 0 refills | Status: AC

## 2021-06-20 NOTE — Progress Notes (Signed)
Gastroenterology Pre-Procedure Review  Request Date: 08/06/21 Requesting Physician: Dr. Allegra Lai  PATIENT REVIEW QUESTIONS: The patient responded to the following health history questions as indicated:    1. Are you having any GI issues? no 2. Do you have a personal history of Polyps? no 3. Do you have a family history of Colon Cancer or Polyps? yes (father colon polyps) 4. Diabetes Mellitus? no 5. Joint replacements in the past 12 months?no 6. Major health problems in the past 3 months?no 7. Any artificial heart valves, MVP, or defibrillator?no    MEDICATIONS & ALLERGIES:    Patient reports the following regarding taking any anticoagulation/antiplatelet therapy:   Plavix, Coumadin, Eliquis, Xarelto, Lovenox, Pradaxa, Brilinta, or Effient? no Aspirin? no  Patient confirms/reports the following medications:  Current Outpatient Medications  Medication Sig Dispense Refill   amLODipine (NORVASC) 5 MG tablet Take 1 tablet (5 mg total) by mouth daily. 90 tablet 3   etonogestrel (NEXPLANON) 68 MG IMPL implant Inject into the skin.     losartan (COZAAR) 50 MG tablet 1.5 daily (Patient taking differently: 1.5 (75 MG) daily) 135 tablet 2   MAGNESIUM GLYCINATE PO Take by mouth. 350 mg daily every other day     metoprolol succinate (TOPROL XL) 25 MG 24 hr tablet Take 1 tablet (25 mg total) by mouth daily. 30 tablet 11   Multiple Vitamin (MULTIVITAMIN) tablet Take 1 tablet by mouth daily.     No current facility-administered medications for this visit.    Patient confirms/reports the following allergies:  No Known Allergies  No orders of the defined types were placed in this encounter.   AUTHORIZATION INFORMATION Primary Insurance: 1D#: Group #:  Secondary Insurance: 1D#: Group #:  SCHEDULE INFORMATION: Date: 08/06/21 Time: Location: [facility]

## 2021-06-23 MED ORDER — AMLODIPINE BESYLATE 5 MG PO TABS: 2.5000 mg | ORAL_TABLET | Freq: Every day | ORAL | 3 refills | Status: DC

## 2021-08-06 ENCOUNTER — Encounter
Admission: RE | Disposition: A | Discharge status: Home / Self Care | Payer: Self-pay | Source: Home / Self Care | Attending: Gastroenterology

## 2021-08-06 ENCOUNTER — Ambulatory Visit: Payer: BC Managed Care – PPO | Admitting: Anesthesiology

## 2021-08-06 ENCOUNTER — Ambulatory Visit
Admission: RE | Admit: 2021-08-06 | Discharge: 2021-08-06 | Disposition: A | Discharge status: Home / Self Care | Payer: BC Managed Care – PPO | Attending: Gastroenterology | Admitting: Gastroenterology

## 2021-08-06 ENCOUNTER — Encounter: Payer: Self-pay | Admitting: Gastroenterology

## 2021-08-06 DIAGNOSIS — Z1211 Encounter for screening for malignant neoplasm of colon: Secondary | ICD-10-CM

## 2021-08-06 DIAGNOSIS — Z79899 Other long term (current) drug therapy: Secondary | ICD-10-CM | POA: Insufficient documentation

## 2021-08-06 DIAGNOSIS — K621 Rectal polyp: Secondary | ICD-10-CM | POA: Diagnosis not present

## 2021-08-06 DIAGNOSIS — K635 Polyp of colon: Secondary | ICD-10-CM | POA: Diagnosis not present

## 2021-08-06 DIAGNOSIS — I10 Essential (primary) hypertension: Secondary | ICD-10-CM | POA: Insufficient documentation

## 2021-08-06 DIAGNOSIS — Z793 Long term (current) use of hormonal contraceptives: Secondary | ICD-10-CM | POA: Insufficient documentation

## 2021-08-06 HISTORY — PX: COLONOSCOPY WITH PROPOFOL: SHX5780

## 2021-08-06 LAB — POCT PREGNANCY, URINE: Preg Test, Ur: NEGATIVE

## 2021-08-06 SURGERY — COLONOSCOPY WITH PROPOFOL
Anesthesia: General

## 2021-08-06 MED ORDER — PROPOFOL 10 MG/ML IV BOLUS
INTRAVENOUS | Status: DC | PRN
  Administered 2021-08-06 (×2): 20 mg via INTRAVENOUS
  Administered 2021-08-06: 60 mg via INTRAVENOUS

## 2021-08-06 MED ORDER — LIDOCAINE HCL (CARDIAC) PF 100 MG/5ML IV SOSY
PREFILLED_SYRINGE | INTRAVENOUS | Status: DC | PRN
  Administered 2021-08-06: 100 mg via INTRAVENOUS

## 2021-08-06 MED ORDER — PROPOFOL 500 MG/50ML IV EMUL
INTRAVENOUS | Status: DC | PRN
  Administered 2021-08-06: 165 ug/kg/min via INTRAVENOUS

## 2021-08-06 MED ORDER — SODIUM CHLORIDE 0.9 % IV SOLN: INTRAVENOUS | Status: DC

## 2021-08-06 NOTE — Op Note (Signed)
[facility] Gastroenterology Patient Name: Cheryl Hill Procedure Date: 08/06/2021 6:57 AM MRN: [id number] Account #: [id number] Date of Birth: Mar 20, 1976 Admit Type: Outpatient Age: 45 Room: [facility] ENDO ROOM 4 Gender: Female Note Status: Finalized Instrument Name: Prentice Docker [id number] Procedure:             Colonoscopy Indications:           Screening for colorectal malignant neoplasm, This is                         the patient's first colonoscopy Providers:             Toney Reil MD, MD Referring MD:          Dorothyann Peng (Referring MD) Medicines:             General Anesthesia Complications:         No immediate complications. Estimated blood loss: None. Procedure:             Pre-Anesthesia Assessment:                        - Prior to the procedure, a History and Physical was                         performed, and patient medications and allergies were                         reviewed. The patient is competent. The risks and                         benefits of the procedure and the sedation options and                         risks were discussed with the patient. All questions                         were answered and informed consent was obtained.                         Patient identification and proposed procedure were                         verified by the physician, the nurse, the                         anesthesiologist, the anesthetist and the technician                         in the pre-procedure area in the procedure room in the                         endoscopy suite. Mental Status Examination: alert and                         oriented. Airway Examination: normal oropharyngeal                         airway and neck mobility. Respiratory Examination:  clear to auscultation. CV Examination: normal.                         Prophylactic Antibiotics: The patient does not require                         prophylactic  antibiotics. Prior Anticoagulants: The                         patient has taken no previous anticoagulant or                         antiplatelet agents. ASA Grade Assessment: II - A                         patient with mild systemic disease. After reviewing                         the risks and benefits, the patient was deemed in                         satisfactory condition to undergo the procedure. The                         anesthesia plan was to use general anesthesia.                         Immediately prior to administration of medications,                         the patient was re-assessed for adequacy to receive                         sedatives. The heart rate, respiratory rate, oxygen                         saturations, blood pressure, adequacy of pulmonary                         ventilation, and response to care were monitored                         throughout the procedure. The physical status of the                         patient was re-assessed after the procedure.                        After obtaining informed consent, the colonoscope was                         passed under direct vision. Throughout the procedure,                         the patient's blood pressure, pulse, and oxygen                         saturations were monitored continuously. The  Colonoscope was introduced through the anus and                         advanced to the the cecum, identified by appendiceal                         orifice and ileocecal valve. The colonoscopy was                         performed without difficulty. The patient tolerated                         the procedure well. The quality of the bowel                         preparation was evaluated using the BBPS [facility] Bowel                         Preparation Scale) with scores of: Right Colon = 3,                         Transverse Colon = 3 and Left Colon = 3 (entire mucosa                          seen well with no residual staining, small fragments                         of stool or opaque liquid). The total BBPS score                         equals 9. Findings:      The perianal and digital rectal examinations were normal. Pertinent       negatives include normal sphincter tone and no palpable rectal lesions.      A 3 mm polyp was found in the recto-sigmoid colon. The polyp was       sessile. The polyp was removed with a cold snare. Resection was       complete, but the polyp tissue was not retrieved. Estimated blood loss:       none.      The retroflexed view of the distal rectum and anal verge was normal and       showed no anal or rectal abnormalities.      The exam was otherwise without abnormality. Impression:            - One 3 mm polyp at the recto-sigmoid colon, removed                         with a cold snare. Complete resection. Polyp tissue                         not retrieved.                        - The distal rectum and anal verge are normal on                         retroflexion view.                        -  The examination was otherwise normal. Recommendation:        - Discharge patient to home (with escort).                        - Resume previous diet today.                        - Continue present medications.                        - Repeat colonoscopy in 10 years for screening                         purposes. Procedure Code(s):     --- Professional ---                        [phone], Colonoscopy, flexible; with removal of                         tumor(s), polyp(s), or other lesion(s) by snare                         technique Diagnosis Code(s):     --- Professional ---                        Z12.11, Encounter for screening for malignant neoplasm                         of colon                        K63.5, Polyp of colon CPT copyright 2019 [employer]. All rights reserved. The codes documented in this report are preliminary and  upon coder review may  be revised to meet current compliance requirements. Dr. Libby Maw Toney Reil MD, MD 08/06/2021 8:13:28 AM This report has been signed electronically. Number of Addenda: 0 Note Initiated On: 08/06/2021 6:57 AM Scope Withdrawal Time: 0 hours 7 minutes 3 seconds  Total Procedure Duration: 0 hours 13 minutes 24 seconds  Estimated Blood Loss:  Estimated blood loss: none.      [facility]

## 2021-08-06 NOTE — Anesthesia Preprocedure Evaluation (Addendum)
Anesthesia Evaluation  Patient identified by MRN, date of birth, ID band Patient awake    Reviewed: Allergy & Precautions, NPO status , Patient's Chart, lab work & pertinent test results  Airway Mallampati: I  TM Distance: >3 FB Neck ROM: Full    Dental no notable dental hx.    Pulmonary neg pulmonary ROS,    Pulmonary exam normal        Cardiovascular hypertension, Pt. on medications and Pt. on home beta blockers Normal cardiovascular exam     Neuro/Psych  Neuromuscular disease (Neuropathy) negative psych ROS   GI/Hepatic negative GI ROS, Neg liver ROS,   Endo/Other  negative endocrine ROS  Renal/GU negative Renal ROS  negative genitourinary   Musculoskeletal negative musculoskeletal ROS (+)   Abdominal Normal abdominal exam  (+)   Peds negative pediatric ROS (+)  Hematology negative hematology ROS (+)   Anesthesia Other Findings   Reproductive/Obstetrics negative OB ROS                            Anesthesia Physical Anesthesia Plan  ASA: 2  Anesthesia Plan: General   Post-op Pain Management:    Induction: Intravenous  PONV Risk Score and Plan: 3 and TIVA and Treatment may vary due to age or medical condition  Airway Management Planned:   Additional Equipment:   Intra-op Plan:   Post-operative Plan:   Informed Consent: I have reviewed the patients History and Physical, chart, labs and discussed the procedure including the risks, benefits and alternatives for the proposed anesthesia with the patient or authorized representative who has indicated his/her understanding and acceptance.     Dental advisory given  Plan Discussed with: CRNA, Anesthesiologist and Surgeon  Anesthesia Plan Comments:         Anesthesia Quick Evaluation

## 2021-08-06 NOTE — Transfer of Care (Signed)
Immediate Anesthesia Transfer of Care Note  Patient: Cheryl Hill  Procedure(s) Performed: COLONOSCOPY WITH PROPOFOL  Patient Location: Endoscopy Unit  Anesthesia Type:General  Level of Consciousness: awake, drowsy and patient cooperative  Airway & Oxygen Therapy: Patient Spontanous Breathing and Patient connected to face mask oxygen  Post-op Assessment: Report given to RN and Post -op Vital signs reviewed and stable  Post vital signs: Reviewed and stable  Last Vitals:  Vitals Value Taken Time  BP 110/75 08/06/21 0816  Temp    Pulse 109 08/06/21 0816  Resp 16 08/06/21 0816  SpO2 100 % 08/06/21 0816  Vitals shown include unvalidated device data.  Last Pain:  Vitals:   08/06/21 0657  TempSrc: Temporal         Complications: No notable events documented.

## 2021-08-06 NOTE — H&P (Signed)
Arlyss Repress, MD [address]  [address]  [place], [place] [address]  Main: [phone]  Fax: [phone] Pager: [phone]  Primary Care Physician:  Dorothyann Peng, MD Primary Gastroenterologist:  Dr. Arlyss Repress  Pre-Procedure History & Physical: HPI:  Cheryl Hill is a 45 y.o. female is here for an colonoscopy.   Past Medical History:  Diagnosis Date   Atopic dermatitis 11/10/2016   Hypertension     Past Surgical History:  Procedure Laterality Date   OOPHORECTOMY N/A 08/1993    Prior to Admission medications   Medication Sig Start Date End Date Taking? Authorizing Provider  amLODipine (NORVASC) 5 MG tablet Take 0.5 tablets (2.5 mg total) by mouth daily. 06/23/21  Yes Parke Poisson, MD  losartan (COZAAR) 50 MG tablet 1.5 daily Patient taking differently: 1.5 (75 MG) daily 03/11/21  Yes Dorothyann Peng, MD  metoprolol succinate (TOPROL XL) 25 MG 24 hr tablet Take 1 tablet (25 mg total) by mouth daily. 12/10/20 12/10/21 Yes Dorothyann Peng, MD  etonogestrel (NEXPLANON) 68 MG IMPL implant Inject into the skin.    [provider]  MAGNESIUM GLYCINATE PO Take by mouth. 350 mg daily every other day    [provider]  Multiple Vitamin (MULTIVITAMIN) tablet Take 1 tablet by mouth daily.    [provider]    Allergies as of 06/20/2021   (No Known Allergies)    Family History  Problem Relation Age of Onset   Diabetes Mother    Hypertension Mother    Hyperlipidemia Mother    Cataracts Mother    Obesity Mother    Hyperlipidemia Father    Hypertension Father    Prostate cancer Father    Diabetes Father     Social History   Socioeconomic History   Marital status: Married    Spouse name: Not on file   Number of children: Not on file   Years of education: Not on file   Highest education level: Not on file  Occupational History   Not on file  Tobacco Use   Smoking status: Never   Smokeless tobacco: Never  Vaping Use    Vaping Use: Never used  Substance and Sexual Activity   Alcohol use: Never   Drug use: No   Sexual activity: Not on file  Other Topics Concern   Not on file  Social History Narrative   Not on file   Social Determinants of Health   Financial Resource Strain: Not on file  Food Insecurity: Not on file  Transportation Needs: Not on file  Physical Activity: Not on file  Stress: Not on file  Social Connections: Not on file  Intimate Partner Violence: Not on file    Review of Systems: See HPI, otherwise negative ROS  Physical Exam: BP 120/90   Pulse 89   Temp (!) 97.2 F (36.2 C) (Temporal)   Ht 5\' 7"  (1.702 m)   Wt 70.8 kg   SpO2 98%   BMI 24.43 kg/m  General:   Alert,  pleasant and cooperative in NAD Head:  Normocephalic and atraumatic. Neck:  Supple; no masses or thyromegaly. Lungs:  Clear throughout to auscultation.    Heart:  Regular rate and rhythm. Abdomen:  Soft, nontender and nondistended. Normal bowel sounds, without guarding, and without rebound.   Neurologic:  Alert and  oriented x4;  grossly normal neurologically.  Impression/Plan: is here for an colonoscopy to be performed for colon cancer screening  Risks, benefits,  limitations, and alternatives regarding  colonoscopy have been reviewed with the patient.  Questions have been answered.  All parties agreeable.   Lannette Donath, MD  08/06/2021, 7:52 AM

## 2021-08-06 NOTE — Anesthesia Procedure Notes (Signed)
Procedure Name: General with mask airway Date/Time: 08/06/2021 8:06 AM Performed by: Mohammed Kindle, CRNA Pre-anesthesia Checklist: Emergency Drugs available, Patient identified, Suction available and Patient being monitored Patient Re-evaluated:Patient Re-evaluated prior to induction Oxygen Delivery Method: Simple face mask Induction Type: IV induction Placement Confirmation: positive ETCO2, CO2 detector and breath sounds checked- equal and bilateral Dental Injury: Teeth and Oropharynx as per pre-operative assessment

## 2021-08-06 NOTE — Anesthesia Postprocedure Evaluation (Signed)
Anesthesia Post Note  Patient: Education officer, environmental  Procedure(s) Performed: COLONOSCOPY WITH PROPOFOL  Patient location during evaluation: Endoscopy Anesthesia Type: General Level of consciousness: awake and alert Pain management: pain level controlled Vital Signs Assessment: post-procedure vital signs reviewed and stable Respiratory status: spontaneous breathing, nonlabored ventilation and respiratory function stable Cardiovascular status: blood pressure returned to baseline and stable Postop Assessment: no apparent nausea or vomiting Anesthetic complications: no   No notable events documented.   Last Vitals:  Vitals:   08/06/21 0814 08/06/21 0834  BP: 110/75 (!) 129/95  Pulse: (!) 102   Resp: 16   Temp: 36.4 C   SpO2:      Last Pain:  Vitals:   08/06/21 0834  TempSrc:   PainSc: 0-No pain                 Foye Deer

## 2021-08-07 ENCOUNTER — Encounter: Payer: Self-pay | Admitting: Gastroenterology

## 2021-08-08 ENCOUNTER — Ambulatory Visit: Payer: BC Managed Care – PPO | Admitting: Advanced Practice Midwife

## 2021-08-08 ENCOUNTER — Encounter: Payer: Self-pay | Admitting: Advanced Practice Midwife

## 2021-08-08 ENCOUNTER — Other Ambulatory Visit: Payer: Self-pay

## 2021-08-08 VITALS — BP 100/70 | Ht 66.0 in | Wt 157.0 lb

## 2021-08-08 DIAGNOSIS — Z3046 Encounter for surveillance of implantable subdermal contraceptive: Secondary | ICD-10-CM | POA: Diagnosis not present

## 2021-08-08 DIAGNOSIS — L219 Seborrheic dermatitis, unspecified: Secondary | ICD-10-CM | POA: Insufficient documentation

## 2021-08-08 DIAGNOSIS — L74519 Primary focal hyperhidrosis, unspecified: Secondary | ICD-10-CM | POA: Insufficient documentation

## 2021-08-11 ENCOUNTER — Encounter: Payer: Self-pay | Admitting: Advanced Practice Midwife

## 2021-08-11 NOTE — Progress Notes (Signed)
GYNECOLOGY PROCEDURE NOTE Nexplanon Removal and Reinsertion  Nexplanon removal discussed in detail.  Risks of infection, bleeding, nerve injury all reviewed.  Patient understands risks and desires to proceed.  Verbal consent obtained.  Patient is certain she wants the Nexplanon removed.  All questions answered.  She had a normal PAP smear last year per her report. She will sign release for records. She has hot flashes/night sweats that interfere with her sleep. She hesitates to use HRT due to her chronic hypertension. We discussed alternatives/comfort measures.  Review of Systems  Constitutional:  Negative for chills and fever.  HENT:  Negative for congestion, ear discharge, ear pain, hearing loss, sinus pain and sore throat.   Eyes:  Negative for blurred vision and double vision.  Respiratory:  Negative for cough, shortness of breath and wheezing.   Cardiovascular:  Negative for chest pain, palpitations and leg swelling.  Gastrointestinal:  Negative for abdominal pain, blood in stool, constipation, diarrhea, heartburn, melena, nausea and vomiting.  Genitourinary:  Negative for dysuria, flank pain, frequency, hematuria and urgency.  Musculoskeletal:  Negative for back pain, joint pain and myalgias.  Skin:  Negative for itching and rash.  Neurological:  Negative for dizziness, tingling, tremors, sensory change, speech change, focal weakness, seizures, loss of consciousness, weakness and headaches.  Endo/Heme/Allergies:  Negative for environmental allergies. Does not bruise/bleed easily.       Positive for hot flashes  Psychiatric/Behavioral:  Negative for depression, hallucinations, memory loss, substance abuse and suicidal ideas. The patient is not nervous/anxious and does not have insomnia.     Vital Signs: BP 100/70   Ht 5\' 6"  (1.676 m)   Wt 157 lb (71.2 kg)   LMP 08/02/2021 (Approximate)   BMI 25.34 kg/m  Constitutional: Well nourished, well developed female in no acute distress.   HEENT: normal Skin: Warm and dry.  Cardiovascular: Regular rate and rhythm.   Extremity:  no edema   Respiratory: Clear to auscultation bilateral. Normal respiratory effort Neuro: DTRs 2+, Cranial nerves grossly intact Psych: Alert and Oriented x3. No memory deficits. Normal mood and affect.  MS: normal gait, normal bilateral lower extremity ROM/strength/stability.   Procedure: Patient placed in dorsal supine with left arm above head, elbow flexed at 90 degrees, arm resting on examination table.  Nexplanon identified without problems.  Betadine scrub x3.  2.5 ml of 1% lidocaine injected under Nexplanon device without problems.  Sterile gloves applied.  Small 0.5cm incision made at distal tip of Nexplanon device with 11 blade scalpel.  Nexplanon brought to incision and grasped with a small kelly clamp.  Nexplanon removed intact without problems.  Pressure applied to incision.  Hemostasis obtained.     Patient is a 45 y.o. [id number] presenting for Nexplanon insertion as her desired means of contraception.  She provided informed consent, signed copy in the chart, time out was performed. Self reported LMP of Patient's last menstrual period was 08/02/2021 (approximate).  She understands that Nexplanon is a progesterone only therapy, and that patients often have irregular and unpredictable vaginal bleeding or amenorrhea. She understands that other side effects are possible related to systemic progesterone, including but not limited to, headaches, breast tenderness, nausea, and irritability. While effective at preventing pregnancy long acting reversible contraceptives do not prevent transmission of sexually transmitted diseases and use of barrier methods for this purpose was discussed. The placement procedure for Nexplanon was reviewed with the patient in detail including risks of nerve injury, infection, bleeding and injury to other muscles or tendons.  She understands that the Nexplanon implant is good for  3 years and needs to be removed at the end of that time.  She understands that Nexplanon is an extremely effective option for contraception, with failure rate of <1%. This information is reviewed today and all questions were answered. Informed consent was obtained, both verbally and written.   The patient is healthy and has no contraindications to Nexplanon use.   Nexplanon removed form sterile blister packaging,  Device confirmed in needle, before inserting full length of needle, tenting up the skin as the needle was advanced.  The drug eluding rod was then deployed by pulling back the slider per the manufactures recommendation.  The implant was palpable by the clinician as well as the patient.  The insertion site covered and dressed with a steri strip and band aid before applying a kerlex bandage pressure dressing. Minimal blood loss was noted during the procedure.  The patient tolerated the procedure well.   She was instructed to wear the pressure bandage for 24 hours. Patient given post procedure precautions and asked to call for fever, chills, redness or drainage from her incision, bleeding from incision, any sign of infection.  She understands she will likely have a small bruise near site of removal and can remove bandage tomorrow and steri-strips in approximately 1 week.She was given the Nexplanon card and instructed to have the rod removed in 3 years.  Charge [phone] for nexplanon device  Assessment: 45 y.o. year old female now s/p uncomplicated Nexplanon removal and reinsertion.   Parke Poisson, CNM [facility] 08/11/21, 10:45 AM

## 2021-08-11 NOTE — Patient Instructions (Signed)
Perimenopause ?Perimenopause is the normal time of a woman's life when the levels of estrogen, the female hormone produced by the ovaries, begin to decrease. This leads to changes in menstrual periods before they stop completely (menopause). Perimenopause can begin 2-8 years before menopause. During perimenopause, the ovaries may or may not produce an egg and a woman can still become pregnant. ?What are the causes? ?This condition is caused by a natural change in hormone levels that happens as you get older. ?What increases the risk? ?This condition is more likely to start at an earlier age if you have certain medical conditions or have undergone treatments, including: ?A tumor of the pituitary gland in the brain. ?A disease that affects the ovaries and hormone production. ?Certain cancer treatments, such as chemotherapy or hormone therapy, or radiation therapy on the pelvis. ?Heavy smoking and excessive alcohol use. ?Family history of early menopause. ?What are the signs or symptoms? ?Perimenopausal changes affect each woman differently. Symptoms of this condition may include: ?Hot flashes. ?Irregular menstrual periods. ?Night sweats. ?Changes in feelings about sex. This could be a decrease in sex drive or an increased discomfort around your sexuality. ?Vaginal dryness. ?Headaches. ?Mood swings. ?Depression. ?Problems sleeping (insomnia). ?Memory problems or trouble concentrating. ?Irritability. ?Tiredness. ?Weight gain. ?Anxiety. ?Trouble getting pregnant. ?How is this diagnosed? ?This condition is diagnosed based on your medical history, a physical exam, your age, your menstrual history, and your symptoms. Hormone tests may also be done. ?How is this treated? ?In some cases, no treatment is needed. You and your health care provider should make a decision together about whether treatment is necessary. Treatment will be based on your individual condition and preferences. Various treatments are available, such  as: ?Menopausal hormone therapy (MHT). ?Medicines to treat specific symptoms. ?Acupuncture. ?Vitamin or herbal supplements. ?Before starting treatment, make sure to let your health care provider know if you have a personal or family history of: ?Heart disease. ?Breast cancer. ?Blood clots. ?Diabetes. ?Osteoporosis. ?Follow these instructions at home: ?Medicines ?Take over-the-counter and prescription medicines only as told by your health care provider. ?Take vitamin supplements only as told by your health care provider. ?Talk with your health care provider before starting any herbal supplements. ?Lifestyle ? ?Do not use any products that contain nicotine or tobacco, such as cigarettes, e-cigarettes, and chewing tobacco. If you need help quitting, ask your health care provider. ?Get at least 30 minutes of physical activity on 5 or more days each week. ?Eat a balanced diet that includes fresh fruits and vegetables, whole grains, soybeans, eggs, lean meat, and low-fat dairy. ?Avoid alcoholic and caffeinated beverages, as well as spicy foods. This may help prevent hot flashes. ?Get 7-8 hours of sleep each night. ?Dress in layers that can be removed to help you manage hot flashes. ?Find ways to manage stress, such as deep breathing, meditation, or journaling. ?General instructions ? ?Keep track of your menstrual periods, including: ?When they occur. ?How heavy they are and how long they last. ?How much time passes between periods. ?Keep track of your symptoms, noting when they start, how often you have them, and how long they last. ?Use vaginal lubricants or moisturizers to help with vaginal dryness and improve comfort during sex. ?You can still become pregnant if you are having irregular periods. Make sure you use contraception during perimenopause if you do not want to get pregnant. ?Keep all follow-up visits. This is important. This includes any group therapy or counseling. ?Contact a health care provider if: ?You  have   heavy vaginal bleeding or pass blood clots. ?Your period lasts more than 2 days longer than normal. ?Your periods are recurring sooner than 21 days. ?You bleed after having sex. ?You have pain during sex. ?Get help right away if you have: ?Chest pain, trouble breathing, or trouble talking. ?Severe depression. ?Pain when you urinate. ?Severe headaches. ?Vision problems. ?Summary ?Perimenopause is the time when a woman's body begins to move into menopause. This may happen naturally or as a result of other health problems or medical treatments. ?Perimenopause can begin 2-8 years before menopause, and it can last for several years. ?Perimenopausal symptoms can be managed through medicines, lifestyle changes, and complementary therapies such as acupuncture. ?This information is not intended to replace advice given to you by your health care provider. Make sure you discuss any questions you have with your health care provider. ?Document Revised: 03/14/2020 Document Reviewed: 03/14/2020 ?Elsevier Patient Education ? 2022 [employer] ? ?

## 2021-08-17 ENCOUNTER — Other Ambulatory Visit: Payer: Self-pay | Admitting: Internal Medicine

## 2021-09-13 ENCOUNTER — Other Ambulatory Visit: Payer: Self-pay | Admitting: Internal Medicine

## 2021-09-18 ENCOUNTER — Encounter: Payer: Self-pay | Admitting: Internal Medicine

## 2021-11-10 ENCOUNTER — Other Ambulatory Visit: Payer: Self-pay

## 2021-11-10 ENCOUNTER — Ambulatory Visit
Admission: RE | Admit: 2021-11-10 | Discharge: 2021-11-10 | Disposition: A | Discharge status: Home / Self Care | Payer: BC Managed Care – PPO | Source: Ambulatory Visit | Attending: Internal Medicine | Admitting: Internal Medicine

## 2021-11-10 DIAGNOSIS — Z1231 Encounter for screening mammogram for malignant neoplasm of breast: Secondary | ICD-10-CM | POA: Diagnosis not present

## 2021-12-23 ENCOUNTER — Ambulatory Visit: Payer: BC Managed Care – PPO | Admitting: Internal Medicine

## 2021-12-29 NOTE — Progress Notes (Signed)
?Cardiology Office Note:   ? ?Date:  01/08/2022  ? ?ID:  Cheryl Hill, DOB [birth year], MRN [id number] ? ?PCP:  Dorothyann Peng, MD  ?Cardiologist:  Parke Poisson, MD  ?Electrophysiologist:  None  ? ?Referring MD: Dorothyann Peng, MD  ? ?Chief Complaint/Reason for Referral: ?Tachycardia, hypertension follow-up ? ?History of Present Illness:   ? ?Cheryl Hill is a 46 y.o. female with a history of HTN. ? ?Initially presented for for tachycardia.  We reviewed normal echocardiogram results with no worrisome features to suggest an etiology for tachycardia.   ? ?Blood pressure elevated in particular diastolic blood pressure is above goal.  Currently on losartan 50mg  daily, amlodipine 2.5 mg daily, and metoprolol succinate 25 mg daily. Discussed adding diuretic today. ? ?The patient denies chest pain, chest pressure, dyspnea at rest or with exertion, PND, orthopnea, or leg swelling. Denies cough, fever, chills. Denies nausea, vomiting. Denies syncope or presyncope. Denies dizziness or lightheadedness. Denies snoring. ? ? ?Past Medical History:  ?Diagnosis Date  ? Atopic dermatitis 11/10/2016  ? Hypertension   ? ? ?Past Surgical History:  ?Procedure Laterality Date  ? COLONOSCOPY WITH PROPOFOL N/A 08/06/2021  ? Procedure: COLONOSCOPY WITH PROPOFOL;  Surgeon: 08/08/2021, MD;  Location[facility] ENDOSCOPY;  Service: Gastroenterology;  Laterality: N/A;  ? OOPHORECTOMY N/A 08/1993  ? ? ?Current Medications: ?Current Meds  ?Medication Sig  ? amLODipine (NORVASC) 5 MG tablet Take 0.5 tablets (2.5 mg total) by mouth daily.  ? etonogestrel (NEXPLANON) 68 MG IMPL implant Inject 1 each into the skin once. Left arm  ? hydrochlorothiazide (MICROZIDE) 12.5 MG capsule Take 1 capsule (12.5 mg total) by mouth daily.  ? losartan (COZAAR) 50 MG tablet TAKE 1 TABLET BY MOUTH EVERY DAY  ? metoprolol succinate (TOPROL-XL) 25 MG 24 hr tablet TAKE 1 TABLET (25 MG TOTAL) BY MOUTH DAILY.  ? Multiple Vitamin (MULTIVITAMIN)  tablet Take 1 tablet by mouth daily.  ?  ? ?Allergies:   Patient has no known allergies.  ? ?Social History  ? ?Tobacco Use  ? Smoking status: Never  ? Smokeless tobacco: Never  ?Vaping Use  ? Vaping Use: Never used  ?Substance Use Topics  ? Alcohol use: Never  ? Drug use: No  ?  ? ?Family History: ?The patient's family history includes Cataracts in her mother; Diabetes in her father and mother; Hyperlipidemia in her father and mother; Hypertension in her father and mother; Obesity in her mother; Prostate cancer in her father. ? ?ROS:   ?Please see the history of present illness.    ?All other systems reviewed and are negative. ? ?EKGs/Labs/Other Studies Reviewed:   ? ?The following studies were reviewed today: ? ?EKG: NSR, PAC ? ?Recent Labs: ?01/21/2021: Magnesium 2.2; TSH 0.522 ?06/19/2021: ALT 18; BUN 12; Creatinine, Ser 0.89; Hemoglobin 14.9; Platelets 299; Potassium 4.5; Sodium 140  ?Recent Lipid Panel ?   ?Component Value Date/Time  ? CHOL 179 06/19/2021 1028  ? TRIG 65 06/19/2021 1028  ? HDL 47 06/19/2021 1028  ? CHOLHDL 3.8 06/19/2021 1028  ? LDLCALC 120 (H) 06/19/2021 1028  ? ? ?Physical Exam:   ? ?VS:  BP 130/84   Pulse 87   Ht 5\' 6"  (1.676 m)   Wt 157 lb (71.2 kg)   SpO2 100%   BMI 25.34 kg/m?    ? ?Wt Readings from Last 5 Encounters:  ?01/08/22 157 lb (71.2 kg)  ?08/08/21 157 lb (71.2 kg)  ?08/06/21 156 lb (70.8 kg)  ?06/19/21 157  lb 3.2 oz (71.3 kg)  ?06/17/21 157 lb 9.6 oz (71.5 kg)  ?  ?Constitutional: No acute distress ?Eyes: sclera non-icteric, normal conjunctiva and lids ?ENMT: normal dentition, moist mucous membranes ?Cardiovascular: regular rhythm, normal rate, no murmurs. S1 and S2 normal. Radial pulses normal bilaterally. No jugular venous distention.  ?Respiratory: clear to auscultation bilaterally ?GI : normal bowel sounds, soft and nontender. No distention.   ?MSK: extremities warm, well perfused. No edema.  ?NEURO: grossly nonfocal exam, moves all extremities. ?PSYCH: alert and oriented  x 3, normal mood and affect.  ? ? ?ASSESSMENT:   ? ?1. Primary hypertension   ?2. Tachycardia   ?3. Medication management   ? ? ? ?PLAN:   ? ?Primary hypertension-  ?Med management ?- continue losartan 50 mg daily, metoprolol succinate 25 mg a day, and amlodipine 2.5 mg daily.  ?- add hctz 12.5 mg daily today. She will have follow up labs with her PCP in April ? ?Tachycardia-resolved ? ? ?Total time of encounter: ?20 minutes total time of encounter, including 15 minutes spent in face-to-face patient care on the date of this encounter. This time includes coordination of care and counseling regarding above mentioned problem list. Remainder of non-face-to-face time involved reviewing chart documents/testing relevant to the patient encounter and documentation in the medical record. I have independently reviewed documentation from referring provider.  ? ?Weston Brass, MD, [facility] ?[place]  [facility]  ? ? ? ?Medication Adjustments/Labs and Tests Ordered: ?Current medicines are reviewed at length with the patient today.  Concerns regarding medicines are outlined above.  ? ?Orders Placed This Encounter  ?Procedures  ? EKG 12-Lead  ? ? ? ?Meds ordered this encounter  ?Medications  ? hydrochlorothiazide (MICROZIDE) 12.5 MG capsule  ?  Sig: Take 1 capsule (12.5 mg total) by mouth daily.  ?  Dispense:  90 capsule  ?  Refill:  3  ? ? ? ?Patient Instructions  ?Medication Instructions:  ?START: HYDROCHLOROTHIAZIDE 12.5mg  ONCE DAILY  ?*If you need a refill on your cardiac medications before your next appointment, please call your pharmacy* ? ?Lab Work: ?LABS AT YOUR PRIMARY CARE  ?If you have labs (blood work) drawn today and your tests are completely normal, you will receive your results only by: ?MyChart Message (if you have MyChart) OR ?A paper copy in the mail ?If you have any lab test that is abnormal or we need to change your treatment, we will call you to review the results. ? ?Follow-Up: ?At [facility], you  and your health needs are our priority.  As part of our continuing mission to provide you with exceptional heart care, we have created designated Provider Care Teams.  These Care Teams include your primary Cardiologist (physician) and Advanced Practice Providers (APPs -  Physician Assistants and Nurse Practitioners) who all work together to provide you with the care you need, when you need it. ? ?Your next appointment:   ?6 month(s) ? ?The format for your next appointment:   ?In Person ? ?Provider:   ?Parke Poisson, MD   ?  ? ? ? ?

## 2022-01-08 ENCOUNTER — Ambulatory Visit: Payer: BC Managed Care – PPO | Admitting: Internal Medicine

## 2022-01-08 ENCOUNTER — Encounter: Payer: Self-pay | Admitting: Internal Medicine

## 2022-01-08 VITALS — BP 130/84 | HR 87 | Ht 66.0 in | Wt 157.0 lb

## 2022-01-08 DIAGNOSIS — I10 Essential (primary) hypertension: Secondary | ICD-10-CM

## 2022-01-08 DIAGNOSIS — Z79899 Other long term (current) drug therapy: Secondary | ICD-10-CM

## 2022-01-08 DIAGNOSIS — R000 Tachycardia, unspecified: Secondary | ICD-10-CM

## 2022-01-08 MED ORDER — HYDROCHLOROTHIAZIDE 12.5 MG PO CAPS: 12.5000 mg | ORAL_CAPSULE | Freq: Every day | ORAL | 3 refills | Status: DC

## 2022-01-08 NOTE — Patient Instructions (Signed)
Medication Instructions:  ?START: HYDROCHLOROTHIAZIDE 12.5mg  ONCE DAILY  ?*If you need a refill on your cardiac medications before your next appointment, please call your pharmacy* ? ?Lab Work: ?LABS AT YOUR PRIMARY CARE  ?If you have labs (blood work) drawn today and your tests are completely normal, you will receive your results only by: ?MyChart Message (if you have MyChart) OR ?A paper copy in the mail ?If you have any lab test that is abnormal or we need to change your treatment, we will call you to review the results. ? ?Follow-Up: ?At [facility], you and your health needs are our priority.  As part of our continuing mission to provide you with exceptional heart care, we have created designated Provider Care Teams.  These Care Teams include your primary Cardiologist (physician) and Advanced Practice Providers (APPs -  Physician Assistants and Nurse Practitioners) who all work together to provide you with the care you need, when you need it. ? ?Your next appointment:   ?6 month(s) ? ?The format for your next appointment:   ?In Person ? ?Provider:   ?Elouise Munroe, MD   ? ?

## 2022-01-09 DIAGNOSIS — D3131 Benign neoplasm of right choroid: Secondary | ICD-10-CM | POA: Diagnosis not present

## 2022-01-09 DIAGNOSIS — H5213 Myopia, bilateral: Secondary | ICD-10-CM | POA: Diagnosis not present

## 2022-01-21 ENCOUNTER — Ambulatory Visit: Payer: BC Managed Care – PPO | Admitting: Internal Medicine

## 2022-01-21 ENCOUNTER — Encounter: Payer: Self-pay | Admitting: Internal Medicine

## 2022-01-21 VITALS — BP 130/80 | HR 79 | Temp 98.4°F | Ht 66.0 in | Wt 154.0 lb

## 2022-01-21 DIAGNOSIS — Z79899 Other long term (current) drug therapy: Secondary | ICD-10-CM

## 2022-01-21 DIAGNOSIS — I10 Essential (primary) hypertension: Secondary | ICD-10-CM | POA: Diagnosis not present

## 2022-01-21 NOTE — Patient Instructions (Signed)

## 2022-01-21 NOTE — Progress Notes (Signed)
?Rich Brave Llittleton,acting as a Education administrator for Maximino Greenland, MD.,have documented all relevant documentation on the behalf of Maximino Greenland, MD,as directed by  Maximino Greenland, MD while in the presence of Maximino Greenland, MD.  ?This visit occurred during the SARS-CoV-2 public health emergency.  Safety protocols were in place, including screening questions prior to the visit, additional usage of staff PPE, and extensive cleaning of exam room while observing appropriate contact time as indicated for disinfecting solutions. ? ?Subjective:  ?  ? Patient ID: Cheryl Hill , female    DOB: November 21, 1975 , 46 y.o.   MRN: [id number] ? ? ?Chief Complaint  ?Patient presents with  ? Hypertension  ? ? ?HPI ? ?The patient is here today for a blood pressure follow-up. She reports compliance with meds. She denies headaches, chest pain and shortness of breath. She was recently seen by Dr. Margaretann Loveless and started on HCTZ 12.62m daily. She has not had any issues with the medication.  ? ?Hypertension ?This is a chronic problem. The current episode started more than 1 year ago. The problem has been gradually improving since onset. The problem is controlled. Pertinent negatives include no blurred vision, chest pain, palpitations or shortness of breath. Past treatments include angiotensin blockers. The current treatment provides moderate improvement.   ? ?Past Medical History:  ?Diagnosis Date  ? Atopic dermatitis 11/10/2016  ? Hypertension   ?  ? ?Family History  ?Problem Relation Age of Onset  ? Diabetes Mother   ? Hypertension Mother   ? Hyperlipidemia Mother   ? Cataracts Mother   ? Obesity Mother   ? Hyperlipidemia Father   ? Hypertension Father   ? Prostate cancer Father   ? Diabetes Father   ? ? ? ?Current Outpatient Medications:  ?  amLODipine (NORVASC) 5 MG tablet, Take 0.5 tablets (2.5 mg total) by mouth daily., Disp: 90 tablet, Rfl: 3 ?  etonogestrel (NEXPLANON) 68 MG IMPL implant, Inject 1 each into the skin once. Left  arm, Disp: , Rfl:  ?  hydrochlorothiazide (MICROZIDE) 12.5 MG capsule, Take 1 capsule (12.5 mg total) by mouth daily., Disp: 90 capsule, Rfl: 3 ?  losartan (COZAAR) 50 MG tablet, TAKE 1 TABLET BY MOUTH EVERY DAY (Patient taking differently: 75 mg. TAKE 1 1/2 TABLET BY MOUTH EVERY DAY), Disp: 90 tablet, Rfl: 2 ?  metoprolol succinate (TOPROL-XL) 25 MG 24 hr tablet, TAKE 1 TABLET (25 MG TOTAL) BY MOUTH DAILY., Disp: 90 tablet, Rfl: 3 ?  Multiple Vitamin (MULTIVITAMIN) tablet, Take 1 tablet by mouth daily., Disp: , Rfl:   ? ?No Known Allergies  ? ?Review of Systems  ?Constitutional: Negative.   ?Eyes:  Negative for blurred vision.  ?Respiratory: Negative.  Negative for shortness of breath.   ?Cardiovascular: Negative.  Negative for chest pain and palpitations.  ?Neurological: Negative.   ?Psychiatric/Behavioral: Negative.     ? ?Today's Vitals  ? 01/21/22 1453  ?BP: 130/80  ?Pulse: 79  ?Temp: 98.4 ?F (36.9 ?C)  ?TempSrc: Oral  ?Weight: 154 lb (69.9 kg)  ?Height: _0  (1.676 m)  ? ?Body mass index is 24.86 kg/m?.  ?Wt Readings from Last 3 Encounters:  ?01/21/22 154 lb (69.9 kg)  ?01/08/22 157 lb (71.2 kg)  ?08/08/21 157 lb (71.2 kg)  ? ? ?Objective:  ?Physical Exam ?Vitals and nursing note reviewed.  ?Constitutional:   ?   Appearance: Normal appearance.  ?HENT:  ?   Head: Normocephalic and atraumatic.  ?Eyes:  ?  Extraocular Movements: Extraocular movements intact.  ?Cardiovascular:  ?   Rate and Rhythm: Normal rate and regular rhythm.  ?   Heart sounds: Normal heart sounds.  ?Pulmonary:  ?   Effort: Pulmonary effort is normal.  ?   Breath sounds: Normal breath sounds.  ?Musculoskeletal:  ?   Cervical back: Normal range of motion.  ?Skin: ?   General: Skin is warm.  ?Neurological:  ?   General: No focal deficit present.  ?   Mental Status: She is alert.  ?Psychiatric:     ?   Mood and Affect: Mood normal.     ?   Behavior: Behavior normal.  ?    ?Assessment And Plan:  ?   ?1. Essential hypertension, benign ?Comments:  Chronic, HCTZ recently added by Cardiology. She will c/w amlodipine, losartan and metoprolol as well. I will check BMP today.  F/u 6 months for CPE.  ?- CMP14+EGFR ?- Amb Referral To Provider Referral Exercise Program (P.R.E.P) ? ?2. Polypharmacy ?  ?Patient was given opportunity to ask questions. Patient verbalized understanding of the plan and was able to repeat key elements of the plan. All questions were answered to their satisfaction.  ? ?I, Maximino Greenland, MD, have reviewed all documentation for this visit. The documentation on 01/21/22 for the exam, diagnosis, procedures, and orders are all accurate and complete.  ? ?IF YOU HAVE BEEN REFERRED TO A SPECIALIST, IT MAY TAKE 1-2 WEEKS TO SCHEDULE/PROCESS THE REFERRAL. IF YOU HAVE NOT HEARD FROM US/SPECIALIST IN TWO WEEKS, PLEASE GIVE [place] A CALL AT [phone].  ? ?THE PATIENT IS ENCOURAGED TO PRACTICE SOCIAL DISTANCING DUE TO THE COVID-19 PANDEMIC.   ?

## 2022-01-22 LAB — CMP14+EGFR
ALT: 11 IU/L (ref 0–32)
AST: 17 IU/L (ref 0–40)
Albumin/Globulin Ratio: 1.8 (ref 1.2–2.2)
Albumin: 4.7 g/dL (ref 3.8–4.8)
Alkaline Phosphatase: 50 IU/L (ref 44–121)
BUN/Creatinine Ratio: 15 (ref 9–23)
BUN: 14 mg/dL (ref 6–24)
Bilirubin Total: 0.3 mg/dL (ref 0.0–1.2)
CO2: 28 mmol/L (ref 20–29)
Calcium: 10.6 mg/dL — ABNORMAL HIGH (ref 8.7–10.2)
Chloride: 98 mmol/L (ref 96–106)
Creatinine, Ser: 0.94 mg/dL (ref 0.57–1.00)
Globulin, Total: 2.6 g/dL (ref 1.5–4.5)
Glucose: 69 mg/dL — ABNORMAL LOW (ref 70–99)
Potassium: 4.4 mmol/L (ref 3.5–5.2)
Sodium: 139 mmol/L (ref 134–144)
Total Protein: 7.3 g/dL (ref 6.0–8.5)
eGFR: 76 mL/min/{1.73_m2} (ref >59)

## 2022-01-23 ENCOUNTER — Telehealth: Payer: Self-pay

## 2022-01-23 NOTE — Telephone Encounter (Signed)
Called to discuss PREP program, left voicemail ? ?

## 2022-01-23 NOTE — Telephone Encounter (Signed)
She returned my call; lives in [place], works full time so neither facility in [place] are an option/convenent for her at this time. ?

## 2022-04-04 ENCOUNTER — Other Ambulatory Visit: Payer: Self-pay | Admitting: Internal Medicine

## 2022-04-11 DIAGNOSIS — S63501A Unspecified sprain of right wrist, initial encounter: Secondary | ICD-10-CM | POA: Diagnosis not present

## 2022-04-17 ENCOUNTER — Other Ambulatory Visit: Payer: Self-pay | Admitting: Internal Medicine

## 2022-06-30 ENCOUNTER — Encounter: Payer: Self-pay | Admitting: Internal Medicine

## 2022-06-30 ENCOUNTER — Ambulatory Visit (INDEPENDENT_AMBULATORY_CARE_PROVIDER_SITE_OTHER): Payer: BC Managed Care – PPO | Admitting: Internal Medicine

## 2022-06-30 VITALS — BP 110/78 | HR 85 | Temp 98.4°F | Ht 66.0 in | Wt 155.8 lb

## 2022-06-30 DIAGNOSIS — Z0000 Encounter for general adult medical examination without abnormal findings: Secondary | ICD-10-CM | POA: Diagnosis not present

## 2022-06-30 DIAGNOSIS — R0683 Snoring: Secondary | ICD-10-CM

## 2022-06-30 DIAGNOSIS — I10 Essential (primary) hypertension: Secondary | ICD-10-CM | POA: Diagnosis not present

## 2022-06-30 DIAGNOSIS — Z23 Encounter for immunization: Secondary | ICD-10-CM | POA: Diagnosis not present

## 2022-06-30 DIAGNOSIS — Z6825 Body mass index (BMI) 25.0-25.9, adult: Secondary | ICD-10-CM | POA: Diagnosis not present

## 2022-06-30 LAB — POCT URINALYSIS DIPSTICK
Bilirubin, UA: NEGATIVE
Glucose, UA: NEGATIVE
Ketones, UA: NEGATIVE
Leukocytes, UA: NEGATIVE
Nitrite, UA: NEGATIVE
Protein, UA: NEGATIVE
Spec Grav, UA: 1.02 (ref 1.010–1.025)
Urobilinogen, UA: 0.2 E.U./dL
pH, UA: 7 (ref 5.0–8.0)

## 2022-06-30 MED ORDER — AMLODIPINE BESYLATE 2.5 MG PO TABS: 2.5000 mg | ORAL_TABLET | Freq: Every day | ORAL | 3 refills | Status: DC

## 2022-06-30 NOTE — Patient Instructions (Signed)

## 2022-06-30 NOTE — Progress Notes (Unsigned)
Jeri Cos Llittleton,acting as a Neurosurgeon for [name], MD.,have documented all relevant documentation on the behalf of [name], MD,as directed by  [name], MD while in the presence of [name], MD.   Subjective:     Patient ID: Cheryl Hill , female    DOB: 09-Aug-1976 , 46 y.o.   MRN: [id number]   Chief Complaint  Patient presents with   Annual Exam   Hypertension    HPI  The patient is here today for a full physical exam. She is followed by GYN for her pelvic exams.  She is followed by Tresea Mall, CNM at [facility]. She reports compliance with meds. She denies headaches, chest pain and shortness of breath.   Hypertension This is a chronic problem. The current episode started more than 1 year ago. The problem has been gradually improving since onset. The problem is controlled. Pertinent negatives include no blurred vision. Past treatments include angiotensin blockers. The current treatment provides moderate improvement.     Past Medical History:  Diagnosis Date   Atopic dermatitis 11/10/2016   Hypertension      Family History  Problem Relation Age of Onset   Diabetes Mother    Hypertension Mother    Hyperlipidemia Mother    Cataracts Mother    Obesity Mother    Hyperlipidemia Father    Hypertension Father    Prostate cancer Father    Diabetes Father      Current Outpatient Medications:    amLODipine (NORVASC) 2.5 MG tablet, Take 1 tablet (2.5 mg total) by mouth daily., Disp: 90 tablet, Rfl: 3   etonogestrel (NEXPLANON) 68 MG IMPL implant, Inject 1 each into the skin once. Left arm, Disp: , Rfl:    hydrochlorothiazide (MICROZIDE) 12.5 MG capsule, Take 1 capsule (12.5 mg total) by mouth daily., Disp: 90 capsule, Rfl: 3   losartan (COZAAR) 50 MG tablet, Take 1.5 tablets (75 mg total) by mouth daily. TAKE 1 1/2 TABLET BY MOUTH EVERY DAY, Disp: 180 tablet, Rfl: 1   metoprolol succinate (TOPROL-XL) 25 MG 24 hr tablet, TAKE 1  TABLET (25 MG TOTAL) BY MOUTH DAILY., Disp: 90 tablet, Rfl: 3   Multiple Vitamin (MULTIVITAMIN) tablet, Take 1 tablet by mouth daily., Disp: , Rfl:    No Known Allergies    The patient states she uses Nexplanon for birth control. Last LMP was Patient's last menstrual period was 06/21/2022 (approximate).. Negative for Dysmenorrhea. Negative for: breast discharge, breast lump(s), breast pain and breast self exam. Associated symptoms include abnormal vaginal bleeding. Pertinent negatives include abnormal bleeding (hematology), anxiety, decreased libido, depression, difficulty falling sleep, dyspareunia, history of infertility, nocturia, sexual dysfunction, sleep disturbances, urinary incontinence, urinary urgency, vaginal discharge and vaginal itching. Diet regular.The patient states her exercise level is    . The patient's tobacco use is:  Social History   Tobacco Use  Smoking Status Never  Smokeless Tobacco Never  . She has been exposed to passive smoke. The patient's alcohol use is:  Social History   Substance and Sexual Activity  Alcohol Use Never    Review of Systems  Constitutional: Negative.   HENT: Negative.    Eyes: Negative.  Negative for blurred vision.  Respiratory: Negative.    Cardiovascular: Negative.   Gastrointestinal: Negative.   Endocrine: Negative.   Genitourinary: Negative.   Musculoskeletal: Negative.   Skin: Negative.   Allergic/Immunologic: Negative.   Neurological: Negative.   Hematological: Negative.   Psychiatric/Behavioral: Negative.  Today's Vitals   06/30/22 0859  BP: 110/78  Pulse: 85  Temp: 98.4 F (36.9 C)  Weight: 155 lb 12.8 oz (70.7 kg)  Height: $Remove'5\' 6"'WPIcpDd$  (1.676 m)  PainSc: 0-No pain   Body mass index is 25.15 kg/m.  Wt Readings from Last 3 Encounters:  06/30/22 155 lb 12.8 oz (70.7 kg)  01/21/22 154 lb (69.9 kg)  01/08/22 157 lb (71.2 kg)     Objective:  Physical Exam Vitals and nursing note reviewed.  Constitutional:       Appearance: Normal appearance.  HENT:     Head: Normocephalic and atraumatic.     Right Ear: Tympanic membrane, ear canal and external ear normal.     Left Ear: Tympanic membrane, ear canal and external ear normal.     Nose: Nose normal.     Mouth/Throat:     Mouth: Mucous membranes are moist.     Pharynx: Oropharynx is clear.  Eyes:     Extraocular Movements: Extraocular movements intact.     Conjunctiva/sclera: Conjunctivae normal.     Pupils: Pupils are equal, round, and reactive to light.  Cardiovascular:     Rate and Rhythm: Normal rate and regular rhythm.     Pulses: Normal pulses.     Heart sounds: Normal heart sounds.  Pulmonary:     Effort: Pulmonary effort is normal.     Breath sounds: Normal breath sounds.  Chest:  Breasts:    Tanner Score is 5.     Right: Normal.     Left: Normal.  Abdominal:     General: Abdomen is flat. Bowel sounds are normal.     Palpations: Abdomen is soft.  Genitourinary:    Comments: deferred Musculoskeletal:        General: Normal range of motion.     Cervical back: Normal range of motion and neck supple.  Skin:    General: Skin is warm and dry.  Neurological:     General: No focal deficit present.     Mental Status: She is alert and oriented to person, place, and time.  Psychiatric:        Mood and Affect: Mood normal.        Behavior: Behavior normal.       Assessment And Plan:     1. Encounter for general adult medical examination w/o abnormal findings Comments: A full exam was performed. Importance of monthly self breast exams was discussed with the patient. She is up to date with her breast/colon CA screening.  PATIENT IS ADVISED TO GET 30-45 MINUTES REGULAR EXERCISE NO LESS THAN FOUR TO FIVE DAYS PER WEEK - BOTH WEIGHTBEARING EXERCISES AND AEROBIC ARE RECOMMENDED.  PATIENT IS ADVISED TO FOLLOW A HEALTHY DIET WITH AT LEAST SIX FRUITS/VEGGIES PER DAY, DECREASE INTAKE OF RED MEAT, AND TO INCREASE FISH INTAKE TO TWO DAYS PER WEEK.   MEATS/FISH SHOULD NOT BE FRIED, BAKED OR BROILED IS PREFERABLE.  IT IS ALSO IMPORTANT TO CUT BACK ON YOUR SUGAR INTAKE. PLEASE AVOID ANYTHING WITH ADDED SUGAR, CORN SYRUP OR OTHER SWEETENERS. IF YOU MUST USE A SWEETENER, YOU CAN TRY STEVIA. IT IS ALSO IMPORTANT TO AVOID ARTIFICIALLY SWEETENERS AND DIET BEVERAGES. LASTLY, I SUGGEST WEARING SPF 50 SUNSCREEN ON EXPOSED PARTS AND ESPECIALLY WHEN IN THE DIRECT SUNLIGHT FOR AN EXTENDED PERIOD OF TIME.  PLEASE AVOID FAST FOOD RESTAURANTS AND INCREASE YOUR WATER INTAKE. - CBC - CMP14+EGFR - Lipid panel  2. Essential hypertension, benign Comments: Chronic, well controlled. She will c/w  losartan '50mg'$  1-1/2 tab, amlodipine 2.$RemoveBeforeDEI'5mg'[id number]$  and metoprolol daily.  She is encouraged to follow a low sodium diet.  She will rto in 6 months for re-evaluation.  - POCT Urinalysis Dipstick (81002) - Microalbumin / Creatinine Urine Ratio - amLODipine (NORVASC) 2.5 MG tablet; Take 1 tablet (2.5 mg total) by mouth daily.  Dispense: 90 tablet; Refill: 3  3. Snoring Comments: She also has bruxism and non-restorative sleep. She agrees to Neuro eval for OSA, also recommended by her dentist.  - Ambulatory referral to Neurology  4. Immunization due - Flu Vaccine QUAD 6+ mos PF IM (Fluarix Quad PF)  5. BMI 25.0-25.9,adult Comments: She is encouraged to aim for at least 150 minutes of exercise per week.   Patient was given opportunity to ask questions. Patient verbalized understanding of the plan and was able to repeat key elements of the plan. All questions were answered to their satisfaction.   I, Maximino Greenland, MD, have reviewed all documentation for this visit. The documentation on 06/30/22 for the exam, diagnosis, procedures, and orders are all accurate and complete.   THE PATIENT IS ENCOURAGED TO PRACTICE SOCIAL DISTANCING DUE TO THE COVID-19 PANDEMIC.

## 2022-07-01 LAB — LIPID PANEL
Chol/HDL Ratio: 3.1 ratio (ref 0.0–4.4)
Cholesterol, Total: 132 mg/dL (ref 100–199)
HDL: 43 mg/dL (ref >39)
LDL Chol Calc (NIH): 77 mg/dL (ref 0–99)
Triglycerides: 58 mg/dL (ref 0–149)
VLDL Cholesterol Cal: 12 mg/dL (ref 5–40)

## 2022-07-01 LAB — CMP14+EGFR
ALT: 14 IU/L (ref 0–32)
AST: 16 IU/L (ref 0–40)
Albumin/Globulin Ratio: 1.8 (ref 1.2–2.2)
Albumin: 4.6 g/dL (ref 3.9–4.9)
Alkaline Phosphatase: 46 IU/L (ref 44–121)
BUN/Creatinine Ratio: 15 (ref 9–23)
BUN: 13 mg/dL (ref 6–24)
Bilirubin Total: 0.4 mg/dL (ref 0.0–1.2)
CO2: 25 mmol/L (ref 20–29)
Calcium: 9.6 mg/dL (ref 8.7–10.2)
Chloride: 99 mmol/L (ref 96–106)
Creatinine, Ser: 0.87 mg/dL (ref 0.57–1.00)
Globulin, Total: 2.6 g/dL (ref 1.5–4.5)
Glucose: 83 mg/dL (ref 70–99)
Potassium: 4.2 mmol/L (ref 3.5–5.2)
Sodium: 140 mmol/L (ref 134–144)
Total Protein: 7.2 g/dL (ref 6.0–8.5)
eGFR: 83 mL/min/{1.73_m2} (ref >59)

## 2022-07-01 LAB — MICROALBUMIN / CREATININE URINE RATIO
Creatinine, Urine: 126.9 mg/dL
Microalb/Creat Ratio: 3 mg/g creat (ref 0–29)
Microalbumin, Urine: 4 ug/mL

## 2022-07-01 LAB — CBC
Hematocrit: 43.9 % (ref 34.0–46.6)
Hemoglobin: 14.6 g/dL (ref 11.1–15.9)
MCH: 26.6 pg (ref 26.6–33.0)
MCHC: 33.3 g/dL (ref 31.5–35.7)
MCV: 80 fL (ref 79–97)
Platelets: 306 10*3/uL (ref 150–450)
RBC: 5.49 x10E6/uL — ABNORMAL HIGH (ref 3.77–5.28)
RDW: 13 % (ref 11.7–15.4)
WBC: 8 10*3/uL (ref 3.4–10.8)

## 2022-07-24 DIAGNOSIS — E103522 Type 1 diabetes mellitus with proliferative diabetic retinopathy with traction retinal detachment involving the macula, left eye: Secondary | ICD-10-CM | POA: Diagnosis not present

## 2022-07-24 DIAGNOSIS — H4312 Vitreous hemorrhage, left eye: Secondary | ICD-10-CM | POA: Diagnosis not present

## 2022-07-24 DIAGNOSIS — H43392 Other vitreous opacities, left eye: Secondary | ICD-10-CM | POA: Diagnosis not present

## 2022-07-24 DIAGNOSIS — H53132 Sudden visual loss, left eye: Secondary | ICD-10-CM | POA: Diagnosis not present

## 2022-08-04 ENCOUNTER — Ambulatory Visit (HOSPITAL_COMMUNITY): Payer: 59 | Admitting: Certified Registered"

## 2022-08-04 ENCOUNTER — Encounter (HOSPITAL_COMMUNITY): Payer: Self-pay | Admitting: Ophthalmology

## 2022-08-04 ENCOUNTER — Ambulatory Visit (HOSPITAL_COMMUNITY)
Admit: 2022-08-04 | Discharge: 2022-08-05 | Disposition: A | Discharge status: Home / Self Care | Payer: 59 | Source: Ambulatory Visit | Attending: Ophthalmology | Admitting: Ophthalmology

## 2022-08-04 ENCOUNTER — Other Ambulatory Visit: Payer: Self-pay

## 2022-08-04 ENCOUNTER — Encounter (HOSPITAL_COMMUNITY)
Disposition: A | Discharge status: Home / Self Care | Payer: Self-pay | Source: Ambulatory Visit | Attending: Ophthalmology

## 2022-08-04 ENCOUNTER — Ambulatory Visit (HOSPITAL_BASED_OUTPATIENT_CLINIC_OR_DEPARTMENT_OTHER): Payer: 59 | Admitting: Certified Registered"

## 2022-08-04 DIAGNOSIS — E103522 Type 1 diabetes mellitus with proliferative diabetic retinopathy with traction retinal detachment involving the macula, left eye: Secondary | ICD-10-CM | POA: Diagnosis not present

## 2022-08-04 DIAGNOSIS — E119 Type 2 diabetes mellitus without complications: Secondary | ICD-10-CM

## 2022-08-04 DIAGNOSIS — Z794 Long term (current) use of insulin: Secondary | ICD-10-CM | POA: Diagnosis not present

## 2022-08-04 DIAGNOSIS — E113592 Type 2 diabetes mellitus with proliferative diabetic retinopathy without macular edema, left eye: Secondary | ICD-10-CM | POA: Insufficient documentation

## 2022-08-04 DIAGNOSIS — F17210 Nicotine dependence, cigarettes, uncomplicated: Secondary | ICD-10-CM | POA: Insufficient documentation

## 2022-08-04 DIAGNOSIS — I10 Essential (primary) hypertension: Secondary | ICD-10-CM | POA: Diagnosis not present

## 2022-08-04 DIAGNOSIS — H3342 Traction detachment of retina, left eye: Secondary | ICD-10-CM | POA: Diagnosis present

## 2022-08-04 HISTORY — PX: AIR/FLUID EXCHANGE: SHX6494

## 2022-08-04 HISTORY — PX: MEMBRANE PEEL: SHX5967

## 2022-08-04 HISTORY — PX: INJECTION OF SILICONE OIL: SHX6422

## 2022-08-04 HISTORY — PX: REPAIR OF COMPLEX TRACTION RETINAL DETACHMENT: SHX6217

## 2022-08-04 LAB — CBC
HCT: 47.6 % — ABNORMAL HIGH (ref 36.0–46.0)
Hemoglobin: 15.6 g/dL — ABNORMAL HIGH (ref 12.0–15.0)
MCH: 32 pg (ref 26.0–34.0)
MCHC: 32.8 g/dL (ref 30.0–36.0)
MCV: 97.7 fL (ref 80.0–100.0)
Platelets: 248 10*3/uL (ref 150–400)
RBC: 4.87 MIL/uL (ref 3.87–5.11)
RDW: 13.3 % (ref 11.5–15.5)
WBC: 4.8 10*3/uL (ref 4.0–10.5)
nRBC: 0 % (ref 0.0–0.2)

## 2022-08-04 LAB — HCG, QUANTITATIVE, PREGNANCY: hCG, Beta Chain, Quant, S: 2 m[IU]/mL (ref <5)

## 2022-08-04 LAB — GLUCOSE, CAPILLARY
Glucose-Capillary: 114 mg/dL — ABNORMAL HIGH (ref 70–99)
Glucose-Capillary: 115 mg/dL — ABNORMAL HIGH (ref 70–99)
Glucose-Capillary: 63 mg/dL — ABNORMAL LOW (ref 70–99)
Glucose-Capillary: 64 mg/dL — ABNORMAL LOW (ref 70–99)

## 2022-08-04 LAB — BASIC METABOLIC PANEL
Anion gap: 11 (ref 5–15)
BUN: 9 mg/dL (ref 6–20)
CO2: 23 mmol/L (ref 22–32)
Calcium: 9.4 mg/dL (ref 8.9–10.3)
Chloride: 105 mmol/L (ref 98–111)
Creatinine, Ser: 0.95 mg/dL (ref 0.44–1.00)
GFR, Estimated: >60 mL/min (ref >60)
Glucose, Bld: 105 mg/dL — ABNORMAL HIGH (ref 70–99)
Potassium: 4.6 mmol/L (ref 3.5–5.1)
Sodium: 139 mmol/L (ref 135–145)

## 2022-08-04 SURGERY — REPAIR, RETINAL DETACHMENT, COMPLEX
Anesthesia: General | Site: Eye | Laterality: Left

## 2022-08-04 MED ORDER — INDOCYANINE GREEN 25 MG IV SOLR
INTRAVENOUS | Status: AC
  Filled 2022-08-04: qty 10

## 2022-08-04 MED ORDER — TOBRAMYCIN-DEXAMETHASONE 0.3-0.1 % OP OINT
TOPICAL_OINTMENT | OPHTHALMIC | Status: DC | PRN
  Administered 2022-08-04: 1 via OPHTHALMIC

## 2022-08-04 MED ORDER — MIDAZOLAM HCL 2 MG/2ML IJ SOLN
INTRAMUSCULAR | Status: DC | PRN
  Administered 2022-08-04: 2 mg via INTRAVENOUS

## 2022-08-04 MED ORDER — LIDOCAINE 2% (20 MG/ML) 5 ML SYRINGE
INTRAMUSCULAR | Status: AC
  Filled 2022-08-04: qty 5

## 2022-08-04 MED ORDER — CYCLOPENTOLATE HCL 1 % OP SOLN
1.0000 [drp] | OPHTHALMIC | Status: AC | PRN
  Administered 2022-08-04 (×3): 1 [drp] via OPHTHALMIC
  Filled 2022-08-04: qty 2

## 2022-08-04 MED ORDER — LACTATED RINGERS IV SOLN: INTRAVENOUS | Status: DC

## 2022-08-04 MED ORDER — PROPOFOL 10 MG/ML IV BOLUS
INTRAVENOUS | Status: DC | PRN
  Administered 2022-08-04: 150 mg via INTRAVENOUS

## 2022-08-04 MED ORDER — CEFAZOLIN SUBCONJUNCTIVAL INJECTION 100 MG/0.5 ML
200.0000 mg | INJECTION | SUBCONJUNCTIVAL | Status: DC
  Filled 2022-08-04: qty 5

## 2022-08-04 MED ORDER — POLYVINYL ALCOHOL 1.4 % OP SOLN
1.0000 [drp] | OPHTHALMIC | Status: DC
  Filled 2022-08-04: qty 15

## 2022-08-04 MED ORDER — MIDAZOLAM HCL 2 MG/2ML IJ SOLN
INTRAMUSCULAR | Status: AC
  Filled 2022-08-04: qty 2

## 2022-08-04 MED ORDER — FENTANYL CITRATE (PF) 250 MCG/5ML IJ SOLN
INTRAMUSCULAR | Status: AC
  Filled 2022-08-04: qty 5

## 2022-08-04 MED ORDER — EPINEPHRINE PF 1 MG/ML IJ SOLN
INTRAMUSCULAR | Status: AC
  Filled 2022-08-04: qty 1

## 2022-08-04 MED ORDER — STERILE WATER FOR IRRIGATION IR SOLN
Status: DC | PRN
  Administered 2022-08-04: 500 mL

## 2022-08-04 MED ORDER — NA CHONDROIT SULF-NA HYALURON 40-30 MG/ML IO SOSY
INTRAOCULAR | Status: AC
  Filled 2022-08-04: qty 0.5

## 2022-08-04 MED ORDER — ONDANSETRON HCL 4 MG/2ML IJ SOLN
INTRAMUSCULAR | Status: DC | PRN
  Administered 2022-08-04: 4 mg via INTRAVENOUS

## 2022-08-04 MED ORDER — ROCURONIUM BROMIDE 10 MG/ML (PF) SYRINGE
PREFILLED_SYRINGE | INTRAVENOUS | Status: AC
  Filled 2022-08-04: qty 10

## 2022-08-04 MED ORDER — PROPOFOL 10 MG/ML IV BOLUS
INTRAVENOUS | Status: AC
  Filled 2022-08-04: qty 20

## 2022-08-04 MED ORDER — ROCURONIUM BROMIDE 10 MG/ML (PF) SYRINGE
PREFILLED_SYRINGE | INTRAVENOUS | Status: DC | PRN
  Administered 2022-08-04: 40 mg via INTRAVENOUS
  Administered 2022-08-04: 20 mg via INTRAVENOUS

## 2022-08-04 MED ORDER — DEXAMETHASONE SODIUM PHOSPHATE 10 MG/ML IJ SOLN: INTRAMUSCULAR | Status: DC | PRN

## 2022-08-04 MED ORDER — PHENYLEPHRINE HCL 2.5 % OP SOLN
1.0000 [drp] | OPHTHALMIC | Status: AC | PRN
  Administered 2022-08-04 (×3): 1 [drp] via OPHTHALMIC
  Filled 2022-08-04: qty 2

## 2022-08-04 MED ORDER — FENTANYL CITRATE (PF) 250 MCG/5ML IJ SOLN
INTRAMUSCULAR | Status: DC | PRN
  Administered 2022-08-04: 150 ug via INTRAVENOUS
  Administered 2022-08-04 (×2): 50 ug via INTRAVENOUS

## 2022-08-04 MED ORDER — SODIUM CHLORIDE 0.9 % IV SOLN: INTRAVENOUS | Status: DC

## 2022-08-04 MED ORDER — BSS IO SOLN
INTRAOCULAR | Status: AC
  Filled 2022-08-04: qty 15

## 2022-08-04 MED ORDER — DEXTROSE 50 % IV SOLN
12.5000 g | INTRAVENOUS | Status: AC
  Administered 2022-08-04: 12.5 g via INTRAVENOUS
  Filled 2022-08-04: qty 50

## 2022-08-04 MED ORDER — ACETAMINOPHEN 10 MG/ML IV SOLN: 1000.0000 mg | Freq: Once | INTRAVENOUS | Status: DC | PRN

## 2022-08-04 MED ORDER — ORAL CARE MOUTH RINSE: 15.0000 mL | Freq: Once | OROMUCOSAL | Status: AC

## 2022-08-04 MED ORDER — LIDOCAINE HCL 2 % IJ SOLN
INTRAMUSCULAR | Status: DC | PRN
  Administered 2022-08-04: 2 mL via RETROBULBAR

## 2022-08-04 MED ORDER — EPINEPHRINE PF 1 MG/ML IJ SOLN: INTRAOCULAR | Status: DC | PRN

## 2022-08-04 MED ORDER — GLYCOPYRROLATE PF 0.2 MG/ML IJ SOSY
PREFILLED_SYRINGE | INTRAMUSCULAR | Status: DC | PRN
  Administered 2022-08-04: .2 mg via INTRAVENOUS

## 2022-08-04 MED ORDER — PHENYLEPHRINE 80 MCG/ML (10ML) SYRINGE FOR IV PUSH (FOR BLOOD PRESSURE SUPPORT)
PREFILLED_SYRINGE | INTRAVENOUS | Status: AC
  Filled 2022-08-04: qty 10

## 2022-08-04 MED ORDER — OXYCODONE HCL 5 MG PO TABS: 5.0000 mg | ORAL_TABLET | Freq: Once | ORAL | Status: DC | PRN

## 2022-08-04 MED ORDER — NA CHONDROIT SULF-NA HYALURON 40-30 MG/ML IO SOSY
INTRAOCULAR | Status: DC | PRN
  Administered 2022-08-04: 0.5 mL via INTRAOCULAR

## 2022-08-04 MED ORDER — TETRACAINE HCL 0.5 % OP SOLN
OPHTHALMIC | Status: AC
  Filled 2022-08-04: qty 4

## 2022-08-04 MED ORDER — OXYCODONE HCL 5 MG/5ML PO SOLN: 5.0000 mg | Freq: Once | ORAL | Status: DC | PRN

## 2022-08-04 MED ORDER — ARTIFICIAL TEARS OPHTHALMIC OINT
TOPICAL_OINTMENT | OPHTHALMIC | Status: AC
  Filled 2022-08-04: qty 3.5

## 2022-08-04 MED ORDER — HYALURONIDASE HUMAN 150 UNIT/ML IJ SOLN
INTRAMUSCULAR | Status: AC
  Filled 2022-08-04: qty 1

## 2022-08-04 MED ORDER — SUGAMMADEX SODIUM 200 MG/2ML IV SOLN
INTRAVENOUS | Status: DC | PRN
  Administered 2022-08-04: 200 mg via INTRAVENOUS

## 2022-08-04 MED ORDER — AMISULPRIDE (ANTIEMETIC) 5 MG/2ML IV SOLN: 10.0000 mg | Freq: Once | INTRAVENOUS | Status: DC | PRN

## 2022-08-04 MED ORDER — TROPICAMIDE 1 % OP SOLN
1.0000 [drp] | OPHTHALMIC | Status: AC | PRN
  Administered 2022-08-04 (×3): 1 [drp] via OPHTHALMIC
  Filled 2022-08-04: qty 15

## 2022-08-04 MED ORDER — KETOROLAC TROMETHAMINE 30 MG/ML IJ SOLN: 30.0000 mg | Freq: Once | INTRAMUSCULAR | Status: DC

## 2022-08-04 MED ORDER — DEXAMETHASONE SODIUM PHOSPHATE 10 MG/ML IJ SOLN
INTRAMUSCULAR | Status: DC | PRN
  Administered 2022-08-04: 10 mg

## 2022-08-04 MED ORDER — LIDOCAINE 2% (20 MG/ML) 5 ML SYRINGE
INTRAMUSCULAR | Status: DC | PRN
  Administered 2022-08-04: 60 mg via INTRAVENOUS

## 2022-08-04 MED ORDER — CEFAZOLIN SUBCONJUNCTIVAL INJECTION 100 MG/0.5 ML
INJECTION | SUBCONJUNCTIVAL | Status: DC | PRN
  Administered 2022-08-04: 100 mg via SUBCONJUNCTIVAL

## 2022-08-04 MED ORDER — ONDANSETRON HCL 4 MG/2ML IJ SOLN
INTRAMUSCULAR | Status: AC
  Filled 2022-08-04: qty 2

## 2022-08-04 MED ORDER — BUPIVACAINE HCL (PF) 0.75 % IJ SOLN
INTRAMUSCULAR | Status: AC
  Filled 2022-08-04: qty 10

## 2022-08-04 MED ORDER — TOBRAMYCIN-DEXAMETHASONE 0.3-0.1 % OP OINT
TOPICAL_OINTMENT | OPHTHALMIC | Status: AC
  Filled 2022-08-04: qty 3.5

## 2022-08-04 MED ORDER — LIDOCAINE HCL 2 % IJ SOLN
INTRAMUSCULAR | Status: AC
  Filled 2022-08-04: qty 20

## 2022-08-04 MED ORDER — CHLORHEXIDINE GLUCONATE 0.12 % MT SOLN
15.0000 mL | Freq: Once | OROMUCOSAL | Status: AC
  Administered 2022-08-04: 15 mL via OROMUCOSAL
  Filled 2022-08-04: qty 15

## 2022-08-04 MED ORDER — OFLOXACIN 0.3 % OP SOLN
1.0000 [drp] | OPHTHALMIC | Status: AC | PRN
  Administered 2022-08-04 (×3): 1 [drp] via OPHTHALMIC
  Filled 2022-08-04: qty 5

## 2022-08-04 MED ORDER — 0.9 % SODIUM CHLORIDE (POUR BTL) OPTIME
TOPICAL | Status: DC | PRN
  Administered 2022-08-04: 1000 mL

## 2022-08-04 MED ORDER — PROPARACAINE HCL 0.5 % OP SOLN
1.0000 [drp] | OPHTHALMIC | Status: AC | PRN
  Administered 2022-08-04 (×3): 1 [drp] via OPHTHALMIC
  Filled 2022-08-04: qty 15

## 2022-08-04 MED ORDER — PROMETHAZINE HCL 25 MG/ML IJ SOLN: 6.2500 mg | INTRAMUSCULAR | Status: DC | PRN

## 2022-08-04 MED ORDER — BSS PLUS IO SOLN
INTRAOCULAR | Status: AC
  Filled 2022-08-04: qty 500

## 2022-08-04 MED ORDER — FENTANYL CITRATE (PF) 100 MCG/2ML IJ SOLN: 25.0000 ug | INTRAMUSCULAR | Status: DC | PRN

## 2022-08-04 MED ORDER — DEXAMETHASONE SODIUM PHOSPHATE 10 MG/ML IJ SOLN
INTRAMUSCULAR | Status: AC
  Filled 2022-08-04: qty 1

## 2022-08-04 MED ORDER — DEXTROSE 50 % IV SOLN
INTRAVENOUS | Status: AC
  Filled 2022-08-04: qty 50

## 2022-08-04 SURGICAL SUPPLY — 73 items
APPLICATOR COTTON TIP 6 STRL (MISCELLANEOUS) ×1 IMPLANT
APPLICATOR COTTON TIP 6IN STRL (MISCELLANEOUS) IMPLANT
BAND WRIST GAS GREEN (MISCELLANEOUS) IMPLANT
BLADE MVR KNIFE 20G (BLADE) IMPLANT
BLADE STAB KNIFE 15DEG (BLADE) IMPLANT
CABLE BIPOLOR RESECTION CORD (MISCELLANEOUS) IMPLANT
CANNULA ANT CHAM MAIN (OPHTHALMIC RELATED) IMPLANT
CANNULA DUAL BORE 23G (CANNULA) IMPLANT
CANNULA DUALBORE 25G (CANNULA) IMPLANT
CANNULA VLV SOFT TIP 25G (OPHTHALMIC) ×1 IMPLANT
CANNULA VLV SOFT TIP 25GA (OPHTHALMIC) ×1 IMPLANT
CAUTERY EYE LOW TEMP 1300F FIN (OPHTHALMIC RELATED) IMPLANT
CHANDELIER CONSTEL 25G RFID (OPHTHALMIC) IMPLANT
CLSR STERI-STRIP ANTIMIC 1/2X4 (GAUZE/BANDAGES/DRESSINGS) ×1 IMPLANT
COVER MAYO STAND STRL (DRAPES) IMPLANT
DRAPE HALF SHEET 40X57 (DRAPES) ×1 IMPLANT
DRAPE INCISE 51X51 W/FILM STRL (DRAPES) IMPLANT
DRAPE RETRACTOR (MISCELLANEOUS) ×1 IMPLANT
ERASER HMR WETFIELD 23G BP (MISCELLANEOUS) IMPLANT
FORCEPS ECKARDT ILM 25G SERR (OPHTHALMIC RELATED) IMPLANT
FORCEPS GRIESHABER ILM 25G A (INSTRUMENTS) IMPLANT
GAS AUTO FILL CONSTEL (OPHTHALMIC)
GAS AUTO FILL CONSTELLATION (OPHTHALMIC) IMPLANT
GAS WRIST BAND GREEN (MISCELLANEOUS)
GLOVE BIOGEL PI IND STRL 8 (GLOVE) IMPLANT
GLOVE SURG SS PI 6.5 STRL IVOR (GLOVE) IMPLANT
GLOVE SURG SYN 7.5  E (GLOVE) ×1
GLOVE SURG SYN 7.5 E (GLOVE) ×1 IMPLANT
GLOVE SURG SYN 7.5 PF PI (GLOVE) ×1 IMPLANT
GOWN STRL REUS W/ TWL LRG LVL3 (GOWN DISPOSABLE) ×1 IMPLANT
GOWN STRL REUS W/TWL LRG LVL3 (GOWN DISPOSABLE) ×1
KIT BASIN OR (CUSTOM PROCEDURE TRAY) ×1 IMPLANT
KIT TURNOVER KIT B (KITS) ×1 IMPLANT
LENS BIOM SUPER VIEW SET DISP (MISCELLANEOUS) ×1 IMPLANT
MICROPICK 25G (MISCELLANEOUS)
NDL 18GX1X1/2 (RX/OR ONLY) (NEEDLE) ×1 IMPLANT
NDL 25GX 5/8IN NON SAFETY (NEEDLE) ×1 IMPLANT
NDL 27GX1/2 REG BEVEL ECLIP (NEEDLE) ×1 IMPLANT
NDL FILTER BLUNT 18X1 1/2 (NEEDLE) ×1 IMPLANT
NDL HYPO 25GX1X1/2 BEV (NEEDLE) IMPLANT
NDL HYPO 30X.5 LL (NEEDLE) ×2 IMPLANT
NDL RETROBULBAR 25GX1.5 (NEEDLE) ×1 IMPLANT
NEEDLE 18GX1X1/2 (RX/OR ONLY) (NEEDLE) ×3 IMPLANT
NEEDLE 25GX 5/8IN NON SAFETY (NEEDLE) ×1 IMPLANT
NEEDLE 27GX1/2 REG BEVEL ECLIP (NEEDLE) ×1 IMPLANT
NEEDLE FILTER BLUNT 18X1 1/2 (NEEDLE) ×1 IMPLANT
NEEDLE HYPO 25GX1X1/2 BEV (NEEDLE) IMPLANT
NEEDLE HYPO 30X.5 LL (NEEDLE) IMPLANT
NEEDLE RETROBULBAR 25GX1.5 (NEEDLE) ×1 IMPLANT
NS IRRIG 1000ML POUR BTL (IV SOLUTION) ×1 IMPLANT
OIL SILICONE OPHTHALMIC 1000 (Ophthalmic Related) IMPLANT
PACK FRAGMATOME (OPHTHALMIC) IMPLANT
PACK VITRECTOMY CUSTOM (CUSTOM PROCEDURE TRAY) ×1 IMPLANT
PAD ARMBOARD 7.5X6 YLW CONV (MISCELLANEOUS) ×2 IMPLANT
PAK PIK VITRECTOMY CVS 25GA (OPHTHALMIC) ×1 IMPLANT
PIC ILLUMINATED 25G (OPHTHALMIC) ×1
PICK MICROPICK 25G (MISCELLANEOUS) IMPLANT
PIK ILLUMINATED 25G (OPHTHALMIC) IMPLANT
PROBE ENDO DIATHERMY 25G (MISCELLANEOUS) IMPLANT
PROBE LASER ILLUM FLEX CVD 25G (OPHTHALMIC) IMPLANT
ROLLS DENTAL (MISCELLANEOUS) IMPLANT
SCRAPER DIAMOND 25GA (OPHTHALMIC RELATED) IMPLANT
SOL ANTI FOG 6CC (MISCELLANEOUS) ×1 IMPLANT
SOLUTION ANTI FOG 6CC (MISCELLANEOUS) ×1
STOPCOCK 4 WAY LG BORE MALE ST (IV SETS) IMPLANT
SUT VICRYL 7 0 TG140 8 (SUTURE) ×1 IMPLANT
SUT VICRYL 8 0 TG140 8 (SUTURE) IMPLANT
SYR 10ML LL (SYRINGE) IMPLANT
SYR 20ML LL LF (SYRINGE) ×1 IMPLANT
SYR 5ML LL (SYRINGE) ×1 IMPLANT
SYR TB 1ML LUER SLIP (SYRINGE) IMPLANT
WATER STERILE IRR 1000ML POUR (IV SOLUTION) ×1 IMPLANT
WIPE INSTRUMENT VISIWIPE 73X73 (MISCELLANEOUS) IMPLANT

## 2022-08-04 NOTE — Discharge Instructions (Addendum)
DO NOT SLEEP ON BACK, THE EYE PRESSURE CAN GO UP AND CAUSE VISION LOSS   SLEEP ON SIDE WITH NOSE TO PILLOW  DURING DAY KEEP UPRIGHT 

## 2022-08-04 NOTE — Anesthesia Procedure Notes (Signed)
Procedure Name: Intubation Date/Time: 08/04/2022 8:26 PM  Performed by: Alain Marion, CRNAPre-anesthesia Checklist: Patient identified, Emergency Drugs available, Suction available and Patient being monitored Patient Re-evaluated:Patient Re-evaluated prior to induction Oxygen Delivery Method: Circle System Utilized Preoxygenation: Pre-oxygenation with 100% oxygen Induction Type: IV induction Ventilation: Mask ventilation without difficulty Laryngoscope Size: Miller and 2 Grade View: Grade I Tube type: Oral Tube size: 7.0 mm Number of attempts: 1 Airway Equipment and Method: Stylet and Oral airway Placement Confirmation: ETT inserted through vocal cords under direct vision, positive ETCO2 and breath sounds checked- equal and bilateral Tube secured with: Tape Dental Injury: Teeth and Oropharynx as per pre-operative assessment

## 2022-08-04 NOTE — Brief Op Note (Signed)
08/04/2022  10:51 PM  PATIENT:  Alyssa Norman  46 y.o. female  PRE-OPERATIVE DIAGNOSIS: Tractional retinal detachment left eye  POST-OPERATIVE DIAGNOSIS:  Tractional retinal detachment left eye  PROCEDURE:  Procedure(s): REPAIR OF COMPLEX TRACTION RETINAL DETACHMENT (Left) Vitrectomy, membranectomy, membrane peeling, endocautery, Drainage of subretinal fluid, endolaser, air/fluid exchange and silicone oil tamponade (Left)  SURGEON:  Surgeon(s) and Role:    * Jalene Mullet, MD - Primary  PHYSICIAN ASSISTANT:   ASSISTANTS: none   ANESTHESIA:   local and general  EBL:  minimal    BLOOD ADMINISTERED:none  DRAINS: none   LOCAL MEDICATIONS USED:  MARCAINE    and LIDOCAINE   SPECIMEN:  No Specimen  DISPOSITION OF SPECIMEN:  N/A  COUNTS:  YES  TOURNIQUET:  * No tourniquets in log *  DICTATION: .Note written in EPIC  PLAN OF CARE: Discharge to home after PACU  PATIENT DISPOSITION:  PACU - hemodynamically stable.   Delay start of Pharmacological VTE agent (>24hrs) due to surgical blood loss or risk of bleeding: not applicable

## 2022-08-04 NOTE — Transfer of Care (Signed)
Immediate Anesthesia Transfer of Care Note  Patient: Alyssa Norman  Procedure(s) Performed: REPAIR OF COMPLEX TRACTION RETINAL DETACHMENT (Left)  Patient Location: PACU  Anesthesia Type:General  Level of Consciousness: awake, alert , and oriented  Airway & Oxygen Therapy: Patient Spontanous Breathing and Patient connected to nasal cannula oxygen  Post-op Assessment: Report given to RN, Post -op Vital signs reviewed and stable, and Patient moving all extremities  Post vital signs: Reviewed and stable  Last Vitals:  Vitals Value Taken Time  BP 170/96 08/04/22 2315  Temp    Pulse 101 08/04/22 2317  Resp 20 08/04/22 2317  SpO2 99 % 08/04/22 2317  Vitals shown include unvalidated device data.  Last Pain:  Vitals:   08/04/22 1819  TempSrc: Oral  PainSc: 0-No pain         Complications: No notable events documented.

## 2022-08-04 NOTE — Anesthesia Preprocedure Evaluation (Signed)
Anesthesia Evaluation  Patient identified by MRN, date of birth, ID band Patient awake    Reviewed: Allergy & Precautions, NPO status , Patient's Chart, lab work & pertinent test results  Airway Mallampati: II  TM Distance: >3 FB Neck ROM: Full    Dental no notable dental hx.    Pulmonary Current SmokerPatient did not abstain from smoking.   Pulmonary exam normal        Cardiovascular hypertension, Pt. on medications Normal cardiovascular exam     Neuro/Psych negative neurological ROS  negative psych ROS   GI/Hepatic negative GI ROS, Neg liver ROS,,,  Endo/Other  negative endocrine ROSdiabetes, Insulin Dependent    Renal/GU negative Renal ROS     Musculoskeletal negative musculoskeletal ROS (+)    Abdominal   Peds  Hematology negative hematology ROS (+)   Anesthesia Other Findings left retinal detachment of eye  Reproductive/Obstetrics                             Anesthesia Physical Anesthesia Plan  ASA: 3  Anesthesia Plan: General   Post-op Pain Management:    Induction: Intravenous  PONV Risk Score and Plan: 2 and Ondansetron, Dexamethasone, Midazolam and Treatment may vary due to age or medical condition  Airway Management Planned: Oral ETT  Additional Equipment:   Intra-op Plan:   Post-operative Plan: Extubation in OR  Informed Consent: I have reviewed the patients History and Physical, chart, labs and discussed the procedure including the risks, benefits and alternatives for the proposed anesthesia with the patient or authorized representative who has indicated his/her understanding and acceptance.     Dental advisory given  Plan Discussed with: CRNA  Anesthesia Plan Comments:        Anesthesia Quick Evaluation

## 2022-08-04 NOTE — Op Note (Signed)
DARCELLA SHIFFMAN 08/04/2022 Diagnosis: Tractional retinal detachment left eye  Procedure: REPAIR OF COMPLEX TRACTION RETINAL DETACHMENT (Left) Vitrectomy, membranectomy, membrane peeling, endocautery, Drainage of subretinal fluid, endolaser, air/fluid exchange and silicone oil tamponade (Left) Operative Eye:  left eye  Surgeon: Royston Cowper Estimated Blood Loss: minimal Specimens for Pathology:  None Complications: none    After informed consent was obtained, the patient was brought to the operating room and a time-out confirmed the correct operative eye as the left eye. General anesthesia was obtained in the left eye without complication  The  patient was prepped and draped in the usual fashion for ocular surgery on the  left eye .  A lid speculum was placed.  Infusion line and trocar was placed at the 4 o'clock position approximately 3.5 mm from the surgical limbus.   The infusion line was allowed to run and then clamped when placed at the cannula opening. The line was inserted and secured to the drape with an adhesive strip.   Active trocars/cannula were placed at the 10 and 2 o'clock positions approximately 3.5 mm from the surgical limbus. The cannula was visualized in the vitreous cavity.  The light pipe and vitreous cutter were inserted into the vitreous cavity and a core vitrectomy was performed.  Care taken to sever the vitreous attachments at the disc.  The vitreous was carefully elevated to identify the plane of attachment of the diffuse neovascular fronds.    Attention was directed toward relieving the tractional detachment from the posterior pole in particular in the mid-periphery associated with broad areas of gliosis and fibrosis.  There was notable neovascular fronds with traction and associated hemorrhage inferiorly. Care was taken to elevate the membranes and remove them both with a vitrector and a lighted pick with dissection. Hemostasis of the neovascular fronds was  performed with endocautery and endolaser.   Careful segmentation and delineation of the neovascular fronds was performed.  Following hemostasis, continued dissection of membranes and removal of membranes was performed 360 degrees in the mid peripheral retina. Endolaser was applied to the areas where the neovascular fronds were still present.  Endocautery was performed to limit bleeding from the neovascular fronds and the retinal breaks associated with the ischemic retina.  A complete air/fluid exchange was performed.  3 rows of endolaser were applied 360 degrees to the periphery.  9381 centistoke silicone oil was injected into the eye and a near complete fill was attained. The trocars were sequentially removed and all were noted to be sealed. Subconjunctival injections of Ancef, Decadron, and Lidocaine/Marcained were placed.   The speculum and drapes were removed and the eye was patched with Polymixin/Bacitracin ophthalmic ointment. An eye shield was placed and the patient was transferred alert and conversant with stable vital signs to the post operative recovery area.  The patient tolerated the procedure well and no complications were noted.   Royston Cowper MD

## 2022-08-04 NOTE — H&P (Signed)
Date of examination:  08/04/22  Indication for surgery: Tractional retinal detachment left eye  Pertinent past medical history:  Past Medical History:  Diagnosis Date   Diabetes mellitus ([place])    Hypertension    Smoker 02/01/2019   Vitamin D deficiency 02/01/2019    Pertinent ocular history:  proliferative diabetic retinopathy left eye  Pertinent family history:  Family History  Problem Relation Age of Onset   Diabetes Mother     General:  Healthy appearing patient in no distress.     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

## 2022-08-05 ENCOUNTER — Encounter (HOSPITAL_COMMUNITY): Payer: Self-pay | Admitting: Ophthalmology

## 2022-08-05 LAB — GLUCOSE, CAPILLARY: Glucose-Capillary: 125 mg/dL — ABNORMAL HIGH (ref 70–99)

## 2022-08-05 NOTE — Anesthesia Postprocedure Evaluation (Signed)
Anesthesia Post Note  Patient: Alyssa Norman  Procedure(s) Performed: REPAIR OF COMPLEX TRACTION RETINAL DETACHMENT (Left)     Patient location during evaluation: PACU Anesthesia Type: General Level of consciousness: awake Pain management: pain level controlled Vital Signs Assessment: post-procedure vital signs reviewed and stable Respiratory status: spontaneous breathing, nonlabored ventilation, respiratory function stable and patient connected to nasal cannula oxygen Cardiovascular status: blood pressure returned to baseline and stable Postop Assessment: no apparent nausea or vomiting Anesthetic complications: no   No notable events documented.  Last Vitals:  Vitals:   08/04/22 2345 08/05/22 0000  BP: (!) 169/90 (!) 177/95  Pulse: 93 97  Resp: 19 (!) 22  Temp:    SpO2: 95% 98%    Last Pain:  Vitals:   08/04/22 2315  TempSrc:   PainSc: 0-No pain                 Genessis Flanary P Cali Cuartas

## 2022-08-05 NOTE — Progress Notes (Signed)
Hypoglycemic Event  CBG: 64 @ 23:45 10/24  Treatment: 8 oz juice/soda and crackers  Symptoms: None  Follow-up CBG: Time: 0015 CBG Result: 125  Possible Reasons for Event: Other: NPO for procedure.    Alyssa Norman

## 2022-08-12 DIAGNOSIS — E103591 Type 1 diabetes mellitus with proliferative diabetic retinopathy without macular edema, right eye: Secondary | ICD-10-CM | POA: Diagnosis not present

## 2022-08-12 DIAGNOSIS — H3582 Retinal ischemia: Secondary | ICD-10-CM | POA: Diagnosis not present

## 2022-08-12 DIAGNOSIS — H31093 Other chorioretinal scars, bilateral: Secondary | ICD-10-CM | POA: Diagnosis not present

## 2022-08-26 ENCOUNTER — Encounter: Payer: Self-pay | Admitting: Internal Medicine

## 2022-09-08 ENCOUNTER — Other Ambulatory Visit: Payer: Self-pay | Admitting: Internal Medicine

## 2022-09-17 DIAGNOSIS — E103591 Type 1 diabetes mellitus with proliferative diabetic retinopathy without macular edema, right eye: Secondary | ICD-10-CM | POA: Diagnosis not present

## 2022-09-20 ENCOUNTER — Other Ambulatory Visit: Payer: Self-pay | Admitting: Internal Medicine

## 2022-10-26 DIAGNOSIS — H3582 Retinal ischemia: Secondary | ICD-10-CM | POA: Diagnosis not present

## 2022-10-26 DIAGNOSIS — E103531 Type 1 diabetes mellitus with proliferative diabetic retinopathy with traction retinal detachment not involving the macula, right eye: Secondary | ICD-10-CM | POA: Diagnosis not present

## 2022-10-26 DIAGNOSIS — H31093 Other chorioretinal scars, bilateral: Secondary | ICD-10-CM | POA: Diagnosis not present

## 2022-10-26 DIAGNOSIS — E103591 Type 1 diabetes mellitus with proliferative diabetic retinopathy without macular edema, right eye: Secondary | ICD-10-CM | POA: Diagnosis not present

## 2022-12-09 DIAGNOSIS — T85398A Other mechanical complication of other ocular prosthetic devices, implants and grafts, initial encounter: Secondary | ICD-10-CM | POA: Diagnosis not present

## 2022-12-09 DIAGNOSIS — H3342 Traction detachment of retina, left eye: Secondary | ICD-10-CM | POA: Diagnosis not present

## 2022-12-09 DIAGNOSIS — H59812 Chorioretinal scars after surgery for detachment, left eye: Secondary | ICD-10-CM | POA: Diagnosis not present

## 2022-12-09 DIAGNOSIS — E113592 Type 2 diabetes mellitus with proliferative diabetic retinopathy without macular edema, left eye: Secondary | ICD-10-CM | POA: Diagnosis not present

## 2022-12-09 DIAGNOSIS — H3582 Retinal ischemia: Secondary | ICD-10-CM | POA: Diagnosis not present

## 2022-12-16 ENCOUNTER — Other Ambulatory Visit: Payer: Self-pay | Admitting: Internal Medicine

## 2022-12-29 ENCOUNTER — Ambulatory Visit (INDEPENDENT_AMBULATORY_CARE_PROVIDER_SITE_OTHER): Payer: BC Managed Care – PPO | Admitting: Internal Medicine

## 2022-12-29 ENCOUNTER — Encounter: Payer: Self-pay | Admitting: Internal Medicine

## 2022-12-29 VITALS — BP 118/78 | HR 78 | Temp 98.4°F | Ht 66.0 in | Wt 158.6 lb

## 2022-12-29 DIAGNOSIS — Z1231 Encounter for screening mammogram for malignant neoplasm of breast: Secondary | ICD-10-CM | POA: Diagnosis not present

## 2022-12-29 DIAGNOSIS — H3342 Traction detachment of retina, left eye: Secondary | ICD-10-CM | POA: Diagnosis not present

## 2022-12-29 DIAGNOSIS — T85398A Other mechanical complication of other ocular prosthetic devices, implants and grafts, initial encounter: Secondary | ICD-10-CM | POA: Diagnosis not present

## 2022-12-29 DIAGNOSIS — I10 Essential (primary) hypertension: Secondary | ICD-10-CM

## 2022-12-29 DIAGNOSIS — Z48810 Encounter for surgical aftercare following surgery on the sense organs: Secondary | ICD-10-CM | POA: Diagnosis not present

## 2022-12-29 DIAGNOSIS — Z8669 Personal history of other diseases of the nervous system and sense organs: Secondary | ICD-10-CM | POA: Diagnosis not present

## 2022-12-29 DIAGNOSIS — L905 Scar conditions and fibrosis of skin: Secondary | ICD-10-CM | POA: Diagnosis not present

## 2022-12-29 DIAGNOSIS — E113532 Type 2 diabetes mellitus with proliferative diabetic retinopathy with traction retinal detachment not involving the macula, left eye: Secondary | ICD-10-CM | POA: Diagnosis not present

## 2022-12-29 LAB — CMP14+EGFR
ALT: 21 IU/L (ref 0–32)
AST: 19 IU/L (ref 0–40)
Albumin/Globulin Ratio: 1.5 (ref 1.2–2.2)
Albumin: 4.4 g/dL (ref 3.9–4.9)
Alkaline Phosphatase: 50 IU/L (ref 44–121)
BUN/Creatinine Ratio: 19 (ref 9–23)
BUN: 18 mg/dL (ref 6–24)
Bilirubin Total: <0.2 mg/dL (ref 0.0–1.2)
CO2: 22 mmol/L (ref 20–29)
Calcium: 9.5 mg/dL (ref 8.7–10.2)
Chloride: 102 mmol/L (ref 96–106)
Creatinine, Ser: 0.93 mg/dL (ref 0.57–1.00)
Globulin, Total: 2.9 g/dL (ref 1.5–4.5)
Glucose: 83 mg/dL (ref 70–99)
Potassium: 4.7 mmol/L (ref 3.5–5.2)
Sodium: 139 mmol/L (ref 134–144)
Total Protein: 7.3 g/dL (ref 6.0–8.5)
eGFR: 77 mL/min/{1.73_m2} (ref >59)

## 2022-12-29 MED ORDER — LOSARTAN POTASSIUM-HCTZ 50-12.5 MG PO TABS: 1.0000 | ORAL_TABLET | Freq: Every day | ORAL | 2 refills | Status: DC

## 2022-12-29 NOTE — Progress Notes (Signed)
I,Victoria T Hamilton,acting as a scribe for Maximino Greenland, MD.,have documented all relevant documentation on the behalf of Maximino Greenland, MD,as directed by  Maximino Greenland, MD while in the presence of Maximino Greenland, MD.    Subjective:     Patient ID: Cheryl Hill , female    DOB: Feb 10, 1976 , 47 y.o.   MRN: [id number]   Chief Complaint  Patient presents with   Hypertension    HPI  The patient is here today for a blood pressure follow-up. She reports compliance with meds. She denies headaches, chest pain and shortness of breath.  Patient adds, she would like a referral to dermatologist. She reports scar like tissue or an abscess on the left side of her lower abdomen.   Hypertension This is a chronic problem. The current episode started more than 1 year ago. The problem has been gradually improving since onset. The problem is controlled. Pertinent negatives include no blurred vision, chest pain, palpitations or shortness of breath. Past treatments include angiotensin blockers. The current treatment provides moderate improvement.     Past Medical History:  Diagnosis Date   Atopic dermatitis 11/10/2016   Hypertension      Family History  Problem Relation Age of Onset   Diabetes Mother    Hypertension Mother    Hyperlipidemia Mother    Cataracts Mother    Obesity Mother    Hyperlipidemia Father    Hypertension Father    Prostate cancer Father    Diabetes Father      Current Outpatient Medications:    amLODipine (NORVASC) 2.5 MG tablet, Take 1 tablet (2.5 mg total) by mouth daily., Disp: 90 tablet, Rfl: 3   etonogestrel (NEXPLANON) 68 MG IMPL implant, Inject 1 each into the skin once. Left arm, Disp: , Rfl:    losartan-hydrochlorothiazide (HYZAAR) 50-12.5 MG tablet, Take 1 tablet by mouth daily., Disp: 90 tablet, Rfl: 2   metoprolol succinate (TOPROL-XL) 25 MG 24 hr tablet, TAKE 1 TABLET (25 MG TOTAL) BY MOUTH DAILY., Disp: 90 tablet, Rfl: 3   Multiple  Vitamin (MULTIVITAMIN) tablet, Take 1 tablet by mouth daily., Disp: , Rfl:    No Known Allergies   Review of Systems  Constitutional: Negative.   Eyes:  Negative for blurred vision.  Respiratory: Negative.  Negative for shortness of breath.   Cardiovascular: Negative.  Negative for chest pain and palpitations.  Musculoskeletal: Negative.   Skin:        She c/o scarring in pubic area. Wants to have this removed.   Neurological: Negative.   Psychiatric/Behavioral: Negative.       Today's Vitals   12/29/22 0828  BP: 118/78  Pulse: 78  Temp: 98.4 F (36.9 C)  SpO2: 98%  Weight: 158 lb 9.6 oz (71.9 kg)  Height: 5\' 6"  (1.676 m)   Body mass index is 25.6 kg/m.  Wt Readings from Last 3 Encounters:  12/29/22 158 lb 9.6 oz (71.9 kg)  06/30/22 155 lb 12.8 oz (70.7 kg)  01/21/22 154 lb (69.9 kg)    Objective:  Physical Exam Vitals and nursing note reviewed.  Constitutional:      Appearance: Normal appearance.  HENT:     Head: Normocephalic and atraumatic.     Nose:     Comments: Masked     Mouth/Throat:     Comments: Masked  Eyes:     Extraocular Movements: Extraocular movements intact.  Cardiovascular:     Rate and Rhythm: Normal rate and regular  rhythm.     Heart sounds: Normal heart sounds.  Pulmonary:     Effort: Pulmonary effort is normal.     Breath sounds: Normal breath sounds.  Musculoskeletal:     Cervical back: Normal range of motion.  Skin:    General: Skin is warm.  Neurological:     General: No focal deficit present.     Mental Status: She is alert.  Psychiatric:        Mood and Affect: Mood normal.        Behavior: Behavior normal.         Assessment And Plan:     1. Essential hypertension, benign Comments: Chronic, well controlled. She will c/w losartan/hct - I will send rx combo pill. She will c/w amlodipine and metoprolol as well. F/u 6 months for CPE. - CMP14+EGFR  2. Scar Comments: I will refer her to Derm for further evaluation. -  Ambulatory referral to Dermatology  3. Encounter for screening mammogram for malignant neoplasm of breast - MS 3D SCR MAMMO BILAT BR (aka MM); Future     Patient was given opportunity to ask questions. Patient verbalized understanding of the plan and was able to repeat key elements of the plan. All questions were answered to their satisfaction.   I, Maximino Greenland, MD, have reviewed all documentation for this visit. The documentation on 12/29/22 for the exam, diagnosis, procedures, and orders are all accurate and complete.   IF YOU HAVE BEEN REFERRED TO A SPECIALIST, IT MAY TAKE 1-2 WEEKS TO SCHEDULE/PROCESS THE REFERRAL. IF YOU HAVE NOT HEARD FROM US/SPECIALIST IN TWO WEEKS, PLEASE GIVE [place] A CALL AT [phone].   THE PATIENT IS ENCOURAGED TO PRACTICE SOCIAL DISTANCING DUE TO THE COVID-19 PANDEMIC.

## 2022-12-29 NOTE — Patient Instructions (Signed)
Hypertension, Adult ?Hypertension is another name for high blood pressure. High blood pressure forces your heart to work harder to pump blood. This can cause problems over time. ?There are two numbers in a blood pressure reading. There is a top number (systolic) over a bottom number (diastolic). It is best to have a blood pressure that is below 120/80. ?What are the causes? ?The cause of this condition is not known. Some other conditions can lead to high blood pressure. ?What increases the risk? ?Some lifestyle factors can make you more likely to develop high blood pressure: ?Smoking. ?Not getting enough exercise or physical activity. ?Being overweight. ?Having too much fat, sugar, calories, or salt (sodium) in your diet. ?Drinking too much alcohol. ?Other risk factors include: ?Having any of these conditions: ?Heart disease. ?Diabetes. ?High cholesterol. ?Kidney disease. ?Obstructive sleep apnea. ?Having a family history of high blood pressure and high cholesterol. ?Age. The risk increases with age. ?Stress. ?What are the signs or symptoms? ?High blood pressure may not cause symptoms. Very high blood pressure (hypertensive crisis) may cause: ?Headache. ?Fast or uneven heartbeats (palpitations). ?Shortness of breath. ?Nosebleed. ?Vomiting or feeling like you may vomit (nauseous). ?Changes in how you see. ?Very bad chest pain. ?Feeling dizzy. ?Seizures. ?How is this treated? ?This condition is treated by making healthy lifestyle changes, such as: ?Eating healthy foods. ?Exercising more. ?Drinking less alcohol. ?Your doctor may prescribe medicine if lifestyle changes do not help enough and if: ?Your top number is above 130. ?Your bottom number is above 80. ?Your personal target blood pressure may vary. ?Follow these instructions at home: ?Eating and drinking ? ?If told, follow the DASH eating plan. To follow this plan: ?Fill one half of your plate at each meal with fruits and vegetables. ?Fill one fourth of your plate  at each meal with whole grains. Whole grains include whole-wheat pasta, brown rice, and whole-grain bread. ?Eat or drink low-fat dairy products, such as skim milk or low-fat yogurt. ?Fill one fourth of your plate at each meal with low-fat (lean) proteins. Low-fat proteins include fish, chicken without skin, eggs, beans, and tofu. ?Avoid fatty meat, cured and processed meat, or chicken with skin. ?Avoid pre-made or processed food. ?Limit the amount of salt in your diet to less than 1,500 mg each day. ?Do not drink alcohol if: ?Your doctor tells you not to drink. ?You are pregnant, may be pregnant, or are planning to become pregnant. ?If you drink alcohol: ?Limit how much you have to: ?0-1 drink a day for women. ?0-2 drinks a day for men. ?Know how much alcohol is in your drink. In the [place] one drink equals one 12 oz bottle of beer (355 mL), one 5 oz glass of wine (148 mL), or one 1? oz glass of hard liquor (44 mL). ?Lifestyle ? ?Work with your doctor to stay at a healthy weight or to lose weight. Ask your doctor what the best weight is for you. ?Get at least 30 minutes of exercise that causes your heart to beat faster (aerobic exercise) most days of the week. This may include walking, swimming, or biking. ?Get at least 30 minutes of exercise that strengthens your muscles (resistance exercise) at least 3 days a week. This may include lifting weights or doing Pilates. ?Do not smoke or use any products that contain nicotine or tobacco. If you need help quitting, ask your doctor. ?Check your blood pressure at home as told by your doctor. ?Keep all follow-up visits. ?Medicines ?Take over-the-counter and prescription medicines   only as told by your doctor. Follow directions carefully. ?Do not skip doses of blood pressure medicine. The medicine does not work as well if you skip doses. Skipping doses also puts you at risk for problems. ?Ask your doctor about side effects or reactions to medicines that you should watch  for. ?Contact a doctor if: ?You think you are having a reaction to the medicine you are taking. ?You have headaches that keep coming back. ?You feel dizzy. ?You have swelling in your ankles. ?You have trouble with your vision. ?Get help right away if: ?You get a very bad headache. ?You start to feel mixed up (confused). ?You feel weak or numb. ?You feel faint. ?You have very bad pain in your: ?Chest. ?Belly (abdomen). ?You vomit more than once. ?You have trouble breathing. ?These symptoms may be an emergency. Get help right away. Call 911. ?Do not wait to see if the symptoms will go away. ?Do not drive yourself to the hospital. ?Summary ?Hypertension is another name for high blood pressure. ?High blood pressure forces your heart to work harder to pump blood. ?For most people, a normal blood pressure is less than 120/80. ?Making healthy choices can help lower blood pressure. If your blood pressure does not get lower with healthy choices, you may need to take medicine. ?This information is not intended to replace advice given to you by your health care provider. Make sure you discuss any questions you have with your health care provider. ?Document Revised: 07/17/2021 Document Reviewed: 07/17/2021 ?Elsevier Patient Education ? 2023 Elsevier Inc. ? ?

## 2023-01-14 DIAGNOSIS — H524 Presbyopia: Secondary | ICD-10-CM | POA: Diagnosis not present

## 2023-01-14 DIAGNOSIS — D3131 Benign neoplasm of right choroid: Secondary | ICD-10-CM | POA: Diagnosis not present

## 2023-01-18 DIAGNOSIS — J069 Acute upper respiratory infection, unspecified: Secondary | ICD-10-CM | POA: Diagnosis not present

## 2023-01-18 DIAGNOSIS — Z6825 Body mass index (BMI) 25.0-25.9, adult: Secondary | ICD-10-CM | POA: Diagnosis not present

## 2023-01-18 DIAGNOSIS — H9202 Otalgia, left ear: Secondary | ICD-10-CM | POA: Diagnosis not present

## 2023-02-03 DIAGNOSIS — H35372 Puckering of macula, left eye: Secondary | ICD-10-CM | POA: Diagnosis not present

## 2023-02-03 DIAGNOSIS — H3582 Retinal ischemia: Secondary | ICD-10-CM | POA: Diagnosis not present

## 2023-02-03 DIAGNOSIS — E103591 Type 1 diabetes mellitus with proliferative diabetic retinopathy without macular edema, right eye: Secondary | ICD-10-CM | POA: Diagnosis not present

## 2023-03-26 ENCOUNTER — Encounter: Payer: Self-pay | Admitting: Internal Medicine

## 2023-03-29 ENCOUNTER — Other Ambulatory Visit: Payer: Self-pay

## 2023-03-29 MED ORDER — LOSARTAN POTASSIUM-HCTZ 50-12.5 MG PO TABS: 1.0000 | ORAL_TABLET | Freq: Every day | ORAL | 1 refills | Status: DC

## 2023-04-08 DIAGNOSIS — L910 Hypertrophic scar: Secondary | ICD-10-CM | POA: Diagnosis not present

## 2023-04-08 DIAGNOSIS — L2089 Other atopic dermatitis: Secondary | ICD-10-CM | POA: Diagnosis not present

## 2023-04-12 DIAGNOSIS — H3582 Retinal ischemia: Secondary | ICD-10-CM | POA: Diagnosis not present

## 2023-04-12 DIAGNOSIS — H338 Other retinal detachments: Secondary | ICD-10-CM | POA: Diagnosis not present

## 2023-04-12 DIAGNOSIS — E103591 Type 1 diabetes mellitus with proliferative diabetic retinopathy without macular edema, right eye: Secondary | ICD-10-CM | POA: Diagnosis not present

## 2023-04-12 DIAGNOSIS — H35342 Macular cyst, hole, or pseudohole, left eye: Secondary | ICD-10-CM | POA: Diagnosis not present

## 2023-04-12 DIAGNOSIS — H31093 Other chorioretinal scars, bilateral: Secondary | ICD-10-CM | POA: Diagnosis not present

## 2023-04-29 ENCOUNTER — Ambulatory Visit: Payer: BC Managed Care – PPO

## 2023-04-29 ENCOUNTER — Encounter: Payer: Self-pay | Admitting: Internal Medicine

## 2023-04-29 ENCOUNTER — Telehealth (INDEPENDENT_AMBULATORY_CARE_PROVIDER_SITE_OTHER): Payer: BC Managed Care – PPO | Admitting: Internal Medicine

## 2023-04-29 DIAGNOSIS — I10 Essential (primary) hypertension: Secondary | ICD-10-CM

## 2023-04-29 DIAGNOSIS — U071 COVID-19: Secondary | ICD-10-CM | POA: Diagnosis not present

## 2023-04-29 MED ORDER — NIRMATRELVIR/RITONAVIR (PAXLOVID)TABLET: 3.0000 | ORAL_TABLET | Freq: Two times a day (BID) | ORAL | 0 refills | Status: AC

## 2023-04-29 NOTE — Progress Notes (Signed)
Virtual Visit via VIDEO   This visit type was conducted due to national recommendations for restrictions regarding the COVID-19 Pandemic (e.g. social distancing) in an effort to limit this patient's exposure and mitigate transmission in our community.  Due to her co-morbid illnesses, this patient is at least at moderate risk for complications without adequate follow up.  This format is felt to be most appropriate for this patient at this time.  All issues noted in this document were discussed and addressed.  A limited physical exam was performed with this format.    This visit type was conducted due to national recommendations for restrictions regarding the COVID-19 Pandemic (e.g. social distancing) in an effort to limit this patient's exposure and mitigate transmission in our community.  Patients identity confirmed using two different identifiers.  This format is felt to be most appropriate for this patient at this time.  All issues noted in this document were discussed and addressed.  No physical exam was performed (except for noted visual exam findings with Video Visits).    Date:  05/08/2023   ID:  Cheryl Hill, DOB May 31, 1976, MRN [id number]  Patient Location:    Provider location:   Office    Chief Complaint:  "I HAVE COVID"  History of Present Illness:    Cheryl Hill is a 47 y.o. female who presents via video conferencing for a telehealth visit today.    The patient does have symptoms concerning for COVID-19 infection (fever, chills, cough, or new shortness of breath).   She presents today for covid positive test. She states she was exposed at work. She works in an office setting.  She states someone tested positive on Tuesday.  She felt fine Wednesday; however, she developed cough, scratchy throat and fatigue today.  She took a home test which was positive.  She reports taking test this morning.   She is interested in treatment for COVID.  She has never been  diagnosed in the past.      Past Medical History:  Diagnosis Date   Atopic dermatitis 11/10/2016   Hypertension    Past Surgical History:  Procedure Laterality Date   COLONOSCOPY WITH PROPOFOL N/A 08/06/2021   Procedure: COLONOSCOPY WITH PROPOFOL;  Surgeon: Toney Reil, MD;  Location: [facility] ENDOSCOPY;  Service: Gastroenterology;  Laterality: N/A;   OOPHORECTOMY N/A 08/1993     Current Meds  Medication Sig   [EXPIRED] nirmatrelvir/ritonavir (PAXLOVID) 20 x 150 MG & 10 x 100MG  TABS Take 3 tablets by mouth 2 (two) times daily for 5 days. Patient GFR is 77.     Allergies:   Patient has no known allergies.   Social History   Tobacco Use   Smoking status: Never   Smokeless tobacco: Never  Vaping Use   Vaping status: Never Used  Substance Use Topics   Alcohol use: Never   Drug use: No     Family Hx: The patient's family history includes Cataracts in her mother; Diabetes in her father and mother; Hyperlipidemia in her father and mother; Hypertension in her father and mother; Obesity in her mother; Prostate cancer in her father.  ROS:   Please see the history of present illness.    Review of Systems  Constitutional:  Positive for malaise/fatigue. Negative for chills and fever.  HENT:  Positive for congestion.   Respiratory:  Positive for cough.   Gastrointestinal: Negative.   Genitourinary: Negative.   Musculoskeletal: Negative.   Neurological: Negative.   Endo/Heme/Allergies: Negative.  All other systems reviewed and are negative.   Labs/Other Tests and Data Reviewed:    Recent Labs: 06/30/2022: Hemoglobin 14.6; Platelets 306 12/29/2022: ALT 21; BUN 18; Creatinine, Ser 0.93; Potassium 4.7; Sodium 139   Recent Lipid Panel Lab Results  Component Value Date/Time   CHOL 132 06/30/2022 10:45 AM   TRIG 58 06/30/2022 10:45 AM   HDL 43 06/30/2022 10:45 AM   CHOLHDL 3.1 06/30/2022 10:45 AM   LDLCALC 77 06/30/2022 10:45 AM    Wt Readings from Last 3  Encounters:  12/29/22 158 lb 9.6 oz (71.9 kg)  06/30/22 155 lb 12.8 oz (70.7 kg)  01/21/22 154 lb (69.9 kg)     Exam:    Vital Signs:  There were no vitals taken for this visit.    Physical Exam Vitals and nursing note reviewed.  Constitutional:      Appearance: She is ill-appearing.  HENT:     Head: Normocephalic and atraumatic.  Eyes:     Extraocular Movements: Extraocular movements intact.  Pulmonary:     Effort: Pulmonary effort is normal.  Neurological:     Mental Status: She is alert and oriented to person, place, and time.  Psychiatric:        Mood and Affect: Affect normal.     ASSESSMENT & PLAN:    COVID Assessment & Plan:  She would like treatment, rx paxlovid sent to her pharmacy. Advised to take full course, possible side effects d/w patient. I will also refer her for home monitoring/temperature monitoring program. She is encouraged to email me daily on Mychart to let me know how she is doing. She is encouraged to go to ER should she develop worsening SOB. Pt advised that she will be out of work for five days, although she may return sooner if afebrile greater than 24 hours, she should mask for 10 days. She is also advised to stay well hydrated, move periodically throughout the day and to have a hot beverage daily.  She verbalizes understanding of her treatment plan. All questions were answered to her satisfaction. She understands that she needs to continue to self quarantine    Orders: -     MyChart Temperature FLOWSHEET; Future  Essential hypertension, benign Assessment & Plan: Chronic, no med changes. Advised to continue to follow a low sodium diet. Having HTN puts her at risk for COVID complications, so antiviral tx is appropriate.    Other orders -     nirmatrelvir/ritonavir; Take 3 tablets by mouth 2 (two) times daily for 5 days. Patient GFR is 77.  Dispense: 30 tablet; Refill: 0 -     MYCHART COVID-19 HOME MONITORING PROGRAM; Future     COVID-19  Education: The signs and symptoms of COVID-19 were discussed with the patient and how to seek care for testing (follow up with PCP or arrange E-visit).  The importance of social distancing was discussed today.  Patient Risk:   After full review of this patients clinical status, I feel that they are at least moderate risk at this time.  Time:   Today, I have spent 15 minutes/ seconds with the patient with telehealth technology discussing above diagnoses.     Medication Adjustments/Labs and Tests Ordered: Current medicines are reviewed at length with the patient today.  Concerns regarding medicines are outlined above.   Tests Ordered: No orders of the defined types were placed in this encounter.   Medication Changes: Meds ordered this encounter  Medications   nirmatrelvir/ritonavir (PAXLOVID) 20 x  150 MG & 10 x 100MG  TABS    Sig: Take 3 tablets by mouth 2 (two) times daily for 5 days. Patient GFR is 77.    Dispense:  30 tablet    Refill:  0    Disposition:  Follow up in 2 week(s)  Signed, [name], MD

## 2023-04-29 NOTE — Patient Instructions (Addendum)
oscillococcinum  COVID-19 COVID-19 is an infection caused by a virus called SARS-CoV-2. This type of virus is called a coronavirus. People with COVID-19 may: Have little to no symptoms. Have mild to moderate symptoms that affect their lungs and breathing. Get very sick. What are the causes? COVID-19 is caused by a virus. This virus may be in the air as droplets or on surfaces. It can spread from an infected person when they cough, sneeze, speak, sing, or breathe. You may become infected if: You breathe in the infected droplets in the air. You touch an object that has the virus on it. What increases the risk? You are at risk of getting COVID-19 if you have been around someone with the infection. You may be more likely to get very sick if: You are 17 years old or older. You have certain medical conditions, such as: Heart disease. Diabetes. Chronic respiratory disease. Cancer. Pregnancy. You are immunocompromised. This means your body cannot fight infections easily. You have a disability or trouble moving, meaning you're immobile. What are the signs or symptoms? People may have different symptoms from COVID-19. The symptoms can also be mild to severe. They often show up in 5-6 days after being infected. But they can take up to 14 days to appear. Common symptoms are: Cough. Feeling tired. New loss of taste or smell. Fever. Less common symptoms are: Sore throat. Headache. Body or muscle aches. Diarrhea. A skin rash or odd-colored fingers or toes. Red or irritated eyes. Sometimes, COVID-19 does not cause symptoms. How is this diagnosed? COVID-19 can be diagnosed with tests done in the lab or at home. Fluid from your nose, mouth, or lungs will be used to check for the virus. How is this treated? Treatment for COVID-19 depends on how sick you are. Mild symptoms can be treated at home with rest, fluids, and over-the-counter medicines. Severe symptoms may be treated in a hospital  intensive care unit (ICU). If you have symptoms and are at risk of getting very sick, you may be given a medicine that fights viruses. This medicine is called an antiviral. How is this prevented? To protect yourself from COVID-19: Know your risk factors. Get vaccinated. If your body cannot fight infections easily, talk to your provider about treatment to help prevent COVID-19. Stay at least 1 meter away from others. Wear a well-fitted mask when: You can't stay at a distance from people. You're in a place with poor air flow. Try to be in open spaces with good air flow when in public. Wash your hands often or use an alcohol-based hand sanitizer. Cover your nose and mouth when coughing and sneezing. If you think you have COVID-19 or have been around someone who has it, stay home and be by yourself for 5-10 days. Where to find more information Centers for Disease Control and Prevention (CDC): [employer]er[employer].no World Health Organization [facility]): VisitDestination.com.br Get help right away if: You have trouble breathing or get short of breath. You have pain or pressure in your chest. You cannot speak or move any part of your body. You are confused. Your symptoms get worse. These symptoms may be an emergency. Get help right away. Call 911. Do not wait to see if the symptoms will go away. Do not drive yourself to the hospital. This information is not intended to replace advice given to you by your health care provider. Make sure you discuss any questions you have with your health care provider. Document Revised: 10/06/2022 Document Reviewed: 06/12/2022 Elsevier Patient  Education  2024 [employer].

## 2023-04-30 ENCOUNTER — Telehealth: Payer: Self-pay

## 2023-04-30 ENCOUNTER — Ambulatory Visit: Payer: BC Managed Care – PPO | Admitting: Internal Medicine

## 2023-04-30 ENCOUNTER — Encounter: Payer: Self-pay | Admitting: Internal Medicine

## 2023-04-30 NOTE — Telephone Encounter (Signed)
Called and spoke with patient in regard to COVID survey. Advised patient on worse symptoms of vomiting and weakness. Patient verbal understanding.

## 2023-05-01 ENCOUNTER — Encounter: Payer: Self-pay | Admitting: Internal Medicine

## 2023-05-03 ENCOUNTER — Telehealth: Payer: Self-pay

## 2023-05-03 NOTE — Telephone Encounter (Signed)
Called patient in regard to COVID symptom of diarrhea, to offer advice. No answer, will send advice information to my chart.

## 2023-05-05 ENCOUNTER — Ambulatory Visit: Payer: BC Managed Care – PPO | Admitting: Internal Medicine

## 2023-05-08 ENCOUNTER — Encounter: Payer: Self-pay | Admitting: Internal Medicine

## 2023-05-08 DIAGNOSIS — U071 COVID-19: Secondary | ICD-10-CM | POA: Insufficient documentation

## 2023-05-08 NOTE — Assessment & Plan Note (Signed)
She would like treatment, rx paxlovid sent to her pharmacy. Advised to take full course, possible side effects d/w patient. I will also refer her for home monitoring/temperature monitoring program. She is encouraged to email me daily on Mychart to let me know how she is doing. She is encouraged to go to ER should she develop worsening SOB. Pt advised that she will be out of work for five days, although she may return sooner if afebrile greater than 24 hours, she should mask for 10 days. She is also advised to stay well hydrated, move periodically throughout the day and to have a hot beverage daily.  She verbalizes understanding of her treatment plan. All questions were answered to her satisfaction. She understands that she needs to continue to self quarantine

## 2023-05-08 NOTE — Assessment & Plan Note (Signed)
Chronic, no med changes. Advised to continue to follow a low sodium diet. Having HTN puts her at risk for COVID complications, so antiviral tx is appropriate.

## 2023-05-21 ENCOUNTER — Ambulatory Visit: Payer: BC Managed Care – PPO

## 2023-05-21 DIAGNOSIS — L910 Hypertrophic scar: Secondary | ICD-10-CM | POA: Diagnosis not present

## 2023-05-21 DIAGNOSIS — L309 Dermatitis, unspecified: Secondary | ICD-10-CM | POA: Diagnosis not present

## 2023-05-21 DIAGNOSIS — L2089 Other atopic dermatitis: Secondary | ICD-10-CM | POA: Diagnosis not present

## 2023-05-25 ENCOUNTER — Ambulatory Visit: Payer: BC Managed Care – PPO

## 2023-05-27 ENCOUNTER — Other Ambulatory Visit: Payer: BC Managed Care – PPO

## 2023-05-27 ENCOUNTER — Ambulatory Visit: Payer: BC Managed Care – PPO

## 2023-05-27 VITALS — BP 116/80 | HR 85 | Temp 98.5°F | Ht 66.0 in | Wt 158.0 lb

## 2023-05-27 DIAGNOSIS — Z79899 Other long term (current) drug therapy: Secondary | ICD-10-CM | POA: Diagnosis not present

## 2023-05-27 NOTE — Progress Notes (Signed)
Patient presents today for a BP check and labs, patient currently taking amLODIpine 2.5mg  PM, losartan-hydrochlorothiazide 50-12.5 AM. BP Readings from Last 3 Encounters:  05/27/23 116/80  12/29/22 118/78  06/30/22 110/78   Per provider- looks great, continue with current medications. F/u next visit.

## 2023-05-28 LAB — BASIC METABOLIC PANEL
BUN/Creatinine Ratio: 14 (ref 9–23)
BUN: 13 mg/dL (ref 6–24)
CO2: 27 mmol/L (ref 20–29)
Calcium: 10.4 mg/dL — ABNORMAL HIGH (ref 8.7–10.2)
Chloride: 99 mmol/L (ref 96–106)
Creatinine, Ser: 0.94 mg/dL (ref 0.57–1.00)
Glucose: 51 mg/dL — ABNORMAL LOW (ref 70–99)
Potassium: 4.6 mmol/L (ref 3.5–5.2)
Sodium: 140 mmol/L (ref 134–144)
eGFR: 75 mL/min/{1.73_m2} (ref >59)

## 2023-07-02 ENCOUNTER — Other Ambulatory Visit: Payer: Self-pay | Admitting: Internal Medicine

## 2023-07-02 DIAGNOSIS — I10 Essential (primary) hypertension: Secondary | ICD-10-CM

## 2023-07-08 ENCOUNTER — Encounter: Payer: BC Managed Care – PPO | Admitting: Internal Medicine

## 2023-07-21 DIAGNOSIS — H35722 Serous detachment of retinal pigment epithelium, left eye: Secondary | ICD-10-CM | POA: Diagnosis not present

## 2023-07-21 DIAGNOSIS — H31092 Other chorioretinal scars, left eye: Secondary | ICD-10-CM | POA: Diagnosis not present

## 2023-07-21 DIAGNOSIS — E113512 Type 2 diabetes mellitus with proliferative diabetic retinopathy with macular edema, left eye: Secondary | ICD-10-CM | POA: Diagnosis not present

## 2023-07-21 DIAGNOSIS — E113591 Type 2 diabetes mellitus with proliferative diabetic retinopathy without macular edema, right eye: Secondary | ICD-10-CM | POA: Diagnosis not present

## 2023-07-21 DIAGNOSIS — H35372 Puckering of macula, left eye: Secondary | ICD-10-CM | POA: Diagnosis not present

## 2023-08-02 DIAGNOSIS — E103512 Type 1 diabetes mellitus with proliferative diabetic retinopathy with macular edema, left eye: Secondary | ICD-10-CM | POA: Diagnosis not present

## 2023-08-16 DIAGNOSIS — E113511 Type 2 diabetes mellitus with proliferative diabetic retinopathy with macular edema, right eye: Secondary | ICD-10-CM | POA: Diagnosis not present

## 2023-08-31 DIAGNOSIS — H35342 Macular cyst, hole, or pseudohole, left eye: Secondary | ICD-10-CM | POA: Diagnosis not present

## 2023-08-31 DIAGNOSIS — E113522 Type 2 diabetes mellitus with proliferative diabetic retinopathy with traction retinal detachment involving the macula, left eye: Secondary | ICD-10-CM | POA: Diagnosis not present

## 2023-08-31 DIAGNOSIS — H3342 Traction detachment of retina, left eye: Secondary | ICD-10-CM | POA: Diagnosis not present

## 2023-08-31 DIAGNOSIS — H35372 Puckering of macula, left eye: Secondary | ICD-10-CM | POA: Diagnosis not present

## 2023-09-15 ENCOUNTER — Encounter: Payer: Self-pay | Admitting: Internal Medicine

## 2023-09-15 ENCOUNTER — Ambulatory Visit (INDEPENDENT_AMBULATORY_CARE_PROVIDER_SITE_OTHER): Payer: BC Managed Care – PPO | Admitting: Internal Medicine

## 2023-09-15 VITALS — BP 122/80 | HR 86 | Temp 98.1°F | Ht 66.0 in | Wt 162.0 lb

## 2023-09-15 DIAGNOSIS — I10 Essential (primary) hypertension: Secondary | ICD-10-CM | POA: Diagnosis not present

## 2023-09-15 DIAGNOSIS — Z0000 Encounter for general adult medical examination without abnormal findings: Secondary | ICD-10-CM

## 2023-09-15 DIAGNOSIS — N63 Unspecified lump in unspecified breast: Secondary | ICD-10-CM | POA: Diagnosis not present

## 2023-09-15 LAB — POCT URINALYSIS DIPSTICK
Bilirubin, UA: NEGATIVE
Glucose, UA: NEGATIVE
Ketones, UA: NEGATIVE
Leukocytes, UA: NEGATIVE
Nitrite, UA: NEGATIVE
Protein, UA: NEGATIVE
Spec Grav, UA: >=1.03 — AB (ref 1.010–1.025)
Urobilinogen, UA: 0.2 U/dL
pH, UA: 5.5 (ref 5.0–8.0)

## 2023-09-15 NOTE — Assessment & Plan Note (Signed)
Area of palpable concern correspond to cysts noted on u/s documented in 2021. No need for further testing. W

## 2023-09-15 NOTE — Patient Instructions (Addendum)
Magnesium glycinate Tart cherry mocktail  Health Maintenance, Female Adopting a healthy lifestyle and getting preventive care are important in promoting health and wellness. Ask your health care provider about: The right schedule for you to have regular tests and exams. Things you can do on your own to prevent diseases and keep yourself healthy. What should I know about diet, weight, and exercise? Eat a healthy diet  Eat a diet that includes plenty of vegetables, fruits, low-fat dairy products, and lean protein. Do not eat a lot of foods that are high in solid fats, added sugars, or sodium. Maintain a healthy weight Body mass index (BMI) is used to identify weight problems. It estimates body fat based on height and weight. Your health care provider can help determine your BMI and help you achieve or maintain a healthy weight. Get regular exercise Get regular exercise. This is one of the most important things you can do for your health. Most adults should: Exercise for at least 150 minutes each week. The exercise should increase your heart rate and make you sweat (moderate-intensity exercise). Do strengthening exercises at least twice a week. This is in addition to the moderate-intensity exercise. Spend less time sitting. Even light physical activity can be beneficial. Watch cholesterol and blood lipids Have your blood tested for lipids and cholesterol at 47 years of age, then have this test every 5 years. Have your cholesterol levels checked more often if: Your lipid or cholesterol levels are high. You are older than 47 years of age. You are at high risk for heart disease. What should I know about cancer screening? Depending on your health history and family history, you may need to have cancer screening at various ages. This may include screening for: Breast cancer. Cervical cancer. Colorectal cancer. Skin cancer. Lung cancer. What should I know about heart disease, diabetes, and  high blood pressure? Blood pressure and heart disease High blood pressure causes heart disease and increases the risk of stroke. This is more likely to develop in people who have high blood pressure readings or are overweight. Have your blood pressure checked: Every 3-5 years if you are 62-58 years of age. Every year if you are 57 years old or older. Diabetes Have regular diabetes screenings. This checks your fasting blood sugar level. Have the screening done: Once every three years after age 59 if you are at a normal weight and have a low risk for diabetes. More often and at a younger age if you are overweight or have a high risk for diabetes. What should I know about preventing infection? Hepatitis B If you have a higher risk for hepatitis B, you should be screened for this virus. Talk with your health care provider to find out if you are at risk for hepatitis B infection. Hepatitis C Testing is recommended for: Everyone born from [birth year] through 1965. Anyone with known risk factors for hepatitis C. Sexually transmitted infections (STIs) Get screened for STIs, including gonorrhea and chlamydia, if: You are sexually active and are younger than 47 years of age. You are older than 47 years of age and your health care provider tells you that you are at risk for this type of infection. Your sexual activity has changed since you were last screened, and you are at increased risk for chlamydia or gonorrhea. Ask your health care provider if you are at risk. Ask your health care provider about whether you are at high risk for HIV. Your health care provider may recommend a  prescription medicine to help prevent HIV infection. If you choose to take medicine to prevent HIV, you should first get tested for HIV. You should then be tested every 3 months for as long as you are taking the medicine. Pregnancy If you are about to stop having your period (premenopausal) and you may become pregnant, seek counseling  before you get pregnant. Take 400 to 800 micrograms (mcg) of folic acid every day if you become pregnant. Ask for birth control (contraception) if you want to prevent pregnancy. Osteoporosis and menopause Osteoporosis is a disease in which the bones lose minerals and strength with aging. This can result in bone fractures. If you are 13 years old or older, or if you are at risk for osteoporosis and fractures, ask your health care provider if you should: Be screened for bone loss. Take a calcium or vitamin D supplement to lower your risk of fractures. Be given hormone replacement therapy (HRT) to treat symptoms of menopause. Follow these instructions at home: Alcohol use Do not drink alcohol if: Your health care provider tells you not to drink. You are pregnant, may be pregnant, or are planning to become pregnant. If you drink alcohol: Limit how much you have to: 0-1 drink a day. Know how much alcohol is in your drink. In the [place] one drink equals one 12 oz bottle of beer (355 mL), one 5 oz glass of wine (148 mL), or one 1 oz glass of hard liquor (44 mL). Lifestyle Do not use any products that contain nicotine or tobacco. These products include cigarettes, chewing tobacco, and vaping devices, such as e-cigarettes. If you need help quitting, ask your health care provider. Do not use street drugs. Do not share needles. Ask your health care provider for help if you need support or information about quitting drugs. General instructions Schedule regular health, dental, and eye exams. Stay current with your vaccines. Tell your health care provider if: You often feel depressed. You have ever been abused or do not feel safe at home. Summary Adopting a healthy lifestyle and getting preventive care are important in promoting health and wellness. Follow your health care provider's instructions about healthy diet, exercising, and getting tested or screened for diseases. Follow your health care  provider's instructions on monitoring your cholesterol and blood pressure. This information is not intended to replace advice given to you by your health care provider. Make sure you discuss any questions you have with your health care provider. Document Revised: 02/17/2021 Document Reviewed: 02/17/2021 Elsevier Patient Education  2024 [employer].

## 2023-09-15 NOTE — Assessment & Plan Note (Signed)

## 2023-09-15 NOTE — Assessment & Plan Note (Signed)
Chronic, controlled.  No need for med changes. EKG performed, NSR w/o acute changes. She will continue with amlodipine 2.5mg  daily, losartan/hct 50/12.5mg  daily and metoprolol XL 25mg  daily. She is advised to continue to follow a low sodium diet. She will f/u in six months for re-evaluation.

## 2023-09-15 NOTE — Progress Notes (Signed)
I,[name], CMA,acting as a Neurosurgeon for Cheryl Aliment, MD.,have documented all relevant documentation on the behalf of Cheryl Aliment, MD,as directed by  Cheryl Aliment, MD while in the presence of Cheryl Aliment, MD.  Subjective:    Patient ID: Cheryl Hill , female    DOB: Mar 18, 1976 , 47 y.o.   MRN: [id number]  Chief Complaint  Patient presents with   Annual Exam   Hypertension    HPI  The patient is here today for a full physical exam. She is followed by GYN for her pelvic exams.  She is followed by Tresea Mall, CNM at [facility].  She is scheduled for GYN exam on 10/01/2023. She reports compliance with meds. She denies headaches, chest pain and shortness of breath.   Hypertension This is a chronic problem. The current episode started more than 1 year ago. The problem has been gradually improving since onset. The problem is controlled. Pertinent negatives include no blurred vision. Past treatments include angiotensin blockers, calcium channel blockers and diuretics. The current treatment provides moderate improvement. Compliance problems include exercise.  There is no history of kidney disease or retinopathy.     Past Medical History:  Diagnosis Date   Atopic dermatitis 11/10/2016   Hypertension      Family History  Problem Relation Age of Onset   Diabetes Mother    Hypertension Mother    Hyperlipidemia Mother    Cataracts Mother    Obesity Mother    Hyperlipidemia Father    Hypertension Father    Prostate cancer Father    Diabetes Father      Current Outpatient Medications:    amLODipine (NORVASC) 2.5 MG tablet, TAKE 1 TABLET BY MOUTH EVERY DAY, Disp: 90 tablet, Rfl: 3   etonogestrel (NEXPLANON) 68 MG IMPL implant, Inject 1 each into the skin once. Left arm, Disp: , Rfl:    losartan-hydrochlorothiazide (HYZAAR) 50-12.5 MG tablet, Take 1 tablet by mouth daily., Disp: 90 tablet, Rfl: 1   metoprolol succinate (TOPROL-XL) 25 MG 24 hr tablet,  TAKE 1 TABLET (25 MG TOTAL) BY MOUTH DAILY., Disp: 90 tablet, Rfl: 3   Multiple Vitamin (MULTIVITAMIN) tablet, Take 1 tablet by mouth daily., Disp: , Rfl:    No Known Allergies    The patient states she uses Nexplanon for birth control. Patient's last menstrual period was 09/15/2023 (exact date).. Negative for Dysmenorrhea. Negative for: breast discharge, breast lump(s), breast pain and breast self exam. Associated symptoms include abnormal vaginal bleeding. Pertinent negatives include abnormal bleeding (hematology), anxiety, decreased libido, depression, difficulty falling sleep, dyspareunia, history of infertility, nocturia, sexual dysfunction, sleep disturbances, urinary incontinence, urinary urgency, vaginal discharge and vaginal itching. Diet regular.The patient states her exercise level is  intermittent.   . The patient's tobacco use is:  Social History   Tobacco Use  Smoking Status Never  Smokeless Tobacco Never  . She has been exposed to passive smoke. The patient's alcohol use is:  Social History   Substance and Sexual Activity  Alcohol Use Never   Review of Systems  Constitutional: Negative.   HENT: Negative.    Eyes: Negative.  Negative for blurred vision.  Respiratory: Negative.    Cardiovascular: Negative.   Gastrointestinal: Negative.   Endocrine: Negative.   Genitourinary: Negative.   Musculoskeletal: Negative.   Skin: Negative.   Allergic/Immunologic: Negative.   Neurological: Negative.   Hematological: Negative.   Psychiatric/Behavioral: Negative.       Today's Vitals   09/15/23 1506  BP: 122/80  Pulse: 86  Temp: 98.1 F (36.7 C)  SpO2: 98%  Weight: 162 lb (73.5 kg)  Height: 5\' 6"  (1.676 m)   Body mass index is 26.15 kg/m.  Wt Readings from Last 3 Encounters:  09/15/23 162 lb (73.5 kg)  05/27/23 158 lb (71.7 kg)  12/29/22 158 lb 9.6 oz (71.9 kg)     Objective:  Physical Exam Vitals and nursing note reviewed.  Constitutional:      Appearance:  Normal appearance.  HENT:     Head: Normocephalic and atraumatic.     Right Ear: Tympanic membrane, ear canal and external ear normal.     Left Ear: Tympanic membrane, ear canal and external ear normal.     Nose: Nose normal.     Mouth/Throat:     Mouth: Mucous membranes are moist.     Pharynx: Oropharynx is clear.  Eyes:     Extraocular Movements: Extraocular movements intact.     Conjunctiva/sclera: Conjunctivae normal.     Pupils: Pupils are equal, round, and reactive to light.  Cardiovascular:     Rate and Rhythm: Normal rate and regular rhythm.     Pulses: Normal pulses.     Heart sounds: Normal heart sounds.  Pulmonary:     Effort: Pulmonary effort is normal.     Breath sounds: Normal breath sounds.  Chest:  Breasts:    Tanner Score is 5.     Right: Normal.     Left: Normal.     Comments: Subareolar nodules Abdominal:     General: Abdomen is flat. Bowel sounds are normal.     Palpations: Abdomen is soft.  Genitourinary:    Comments: deferred Musculoskeletal:        General: Normal range of motion.     Cervical back: Normal range of motion and neck supple.  Skin:    General: Skin is warm and dry.  Neurological:     General: No focal deficit present.     Mental Status: She is alert and oriented to person, place, and time.  Psychiatric:        Mood and Affect: Mood normal.        Behavior: Behavior normal.         Assessment And Plan:     Encounter for general adult medical examination w/o abnormal findings Assessment & Plan: A full exam was performed.  Importance of monthly self breast exams was discussed with the patient.  She is advised to get 30-45 minutes of regular exercise, no less than four to five days per week. Both weight-bearing and aerobic exercises are recommended.  She is advised to follow a healthy diet with at least six fruits/veggies per day, decrease intake of red meat and other saturated fats and to increase fish intake to twice weekly.   Meats/fish should not be fried -- baked, boiled or broiled is preferable. It is also important to cut back on your sugar intake.  Be sure to read labels - try to avoid anything with added sugar, high fructose corn syrup or other sweeteners.  If you must use a sweetener, you can try stevia or monkfruit.  It is also important to avoid artificially sweetened foods/beverages and diet drinks. Lastly, wear SPF 50 sunscreen on exposed skin and when in direct sunlight for an extended period of time.  Be sure to avoid fast food restaurants and aim for at least 60 ounces of water daily.      Orders: -  CBC -     CMP14+EGFR -     Lipid panel -     TSH  Essential hypertension, benign Assessment & Plan: Chronic, controlled.  No need for med changes. EKG performed, NSR w/o acute changes. She will continue with amlodipine 2.5mg  daily, losartan/hct 50/12.5mg  daily and metoprolol XL 25mg  daily. She is advised to continue to follow a low sodium diet. She will f/u in six months for re-evaluation.   Orders: -     POCT urinalysis dipstick -     Microalbumin / creatinine urine ratio -     EKG 12-Lead  Breast nodule Assessment & Plan: Area of palpable concern correspond to cysts noted on u/s documented in 2021. No need for further testing. W      Return for 1 year HM, 6 MONTH BPC. Patient was given opportunity to ask questions. Patient verbalized understanding of the plan and was able to repeat key elements of the plan. All questions were answered to their satisfaction.    I, Cheryl Aliment, MD, have reviewed all documentation for this visit. The documentation on 09/15/23 for the exam, diagnosis, procedures, and orders are all accurate and complete.

## 2023-09-16 DIAGNOSIS — E113512 Type 2 diabetes mellitus with proliferative diabetic retinopathy with macular edema, left eye: Secondary | ICD-10-CM | POA: Diagnosis not present

## 2023-09-16 LAB — CBC
Hematocrit: 47 % — ABNORMAL HIGH (ref 34.0–46.6)
Hemoglobin: 14.9 g/dL (ref 11.1–15.9)
MCH: 26.6 pg (ref 26.6–33.0)
MCHC: 31.7 g/dL (ref 31.5–35.7)
MCV: 84 fL (ref 79–97)
Platelets: 331 10*3/uL (ref 150–450)
RBC: 5.6 x10E6/uL — ABNORMAL HIGH (ref 3.77–5.28)
RDW: 13.2 % (ref 11.7–15.4)
WBC: 7.3 10*3/uL (ref 3.4–10.8)

## 2023-09-16 LAB — LIPID PANEL
Chol/HDL Ratio: 3.7 {ratio} (ref 0.0–4.4)
Cholesterol, Total: 172 mg/dL (ref 100–199)
HDL: 46 mg/dL (ref >39)
LDL Chol Calc (NIH): 111 mg/dL — ABNORMAL HIGH (ref 0–99)
Triglycerides: 81 mg/dL (ref 0–149)
VLDL Cholesterol Cal: 15 mg/dL (ref 5–40)

## 2023-09-16 LAB — MICROALBUMIN / CREATININE URINE RATIO
Creatinine, Urine: 155.1 mg/dL
Microalb/Creat Ratio: 5 mg/g{creat} (ref 0–29)
Microalbumin, Urine: 7.7 ug/mL

## 2023-09-16 LAB — CMP14+EGFR
ALT: 14 [IU]/L (ref 0–32)
AST: 21 [IU]/L (ref 0–40)
Albumin: 4.7 g/dL (ref 3.9–4.9)
Alkaline Phosphatase: 50 [IU]/L (ref 44–121)
BUN/Creatinine Ratio: 15 (ref 9–23)
BUN: 13 mg/dL (ref 6–24)
Bilirubin Total: <0.2 mg/dL (ref 0.0–1.2)
CO2: 24 mmol/L (ref 20–29)
Calcium: 9.9 mg/dL (ref 8.7–10.2)
Chloride: 100 mmol/L (ref 96–106)
Creatinine, Ser: 0.87 mg/dL (ref 0.57–1.00)
Globulin, Total: 2.7 g/dL (ref 1.5–4.5)
Glucose: 70 mg/dL (ref 70–99)
Potassium: 4.4 mmol/L (ref 3.5–5.2)
Sodium: 139 mmol/L (ref 134–144)
Total Protein: 7.4 g/dL (ref 6.0–8.5)
eGFR: 83 mL/min/{1.73_m2} (ref >59)

## 2023-09-16 LAB — TSH: TSH: 0.878 u[IU]/mL (ref 0.450–4.500)

## 2023-09-22 ENCOUNTER — Encounter: Payer: Self-pay | Admitting: Internal Medicine

## 2023-10-01 ENCOUNTER — Other Ambulatory Visit (HOSPITAL_COMMUNITY)
Admission: RE | Admit: 2023-10-01 | Discharge: 2023-10-01 | Disposition: A | Discharge status: Home / Self Care | Payer: BC Managed Care – PPO | Source: Ambulatory Visit | Attending: Advanced Practice Midwife | Admitting: Advanced Practice Midwife

## 2023-10-01 ENCOUNTER — Ambulatory Visit (INDEPENDENT_AMBULATORY_CARE_PROVIDER_SITE_OTHER): Payer: BC Managed Care – PPO | Admitting: Advanced Practice Midwife

## 2023-10-01 ENCOUNTER — Encounter: Payer: Self-pay | Admitting: Advanced Practice Midwife

## 2023-10-01 VITALS — BP 110/78 | HR 86 | Ht 66.0 in | Wt 159.0 lb

## 2023-10-01 DIAGNOSIS — N921 Excessive and frequent menstruation with irregular cycle: Secondary | ICD-10-CM

## 2023-10-01 DIAGNOSIS — Z113 Encounter for screening for infections with a predominantly sexual mode of transmission: Secondary | ICD-10-CM

## 2023-10-01 DIAGNOSIS — Z01419 Encounter for gynecological examination (general) (routine) without abnormal findings: Secondary | ICD-10-CM | POA: Insufficient documentation

## 2023-10-01 DIAGNOSIS — Z124 Encounter for screening for malignant neoplasm of cervix: Secondary | ICD-10-CM | POA: Diagnosis present

## 2023-10-01 DIAGNOSIS — N898 Other specified noninflammatory disorders of vagina: Secondary | ICD-10-CM | POA: Insufficient documentation

## 2023-10-01 MED ORDER — ESTRADIOL 0.5 MG PO TABS: 0.5000 mg | ORAL_TABLET | Freq: Every day | ORAL | 11 refills | Status: DC

## 2023-10-01 NOTE — Patient Instructions (Signed)
Perimenopause Perimenopause is the normal time of a woman's life when the levels of estrogen, the female hormone produced by the ovaries, begin to decrease. This leads to changes in menstrual periods before they stop completely (menopause). Perimenopause can begin 2-8 years before menopause. During perimenopause, the ovaries may or may not produce an egg and a woman can still become pregnant. What are the causes? This condition is caused by a natural change in hormone levels that happens as you get older. What increases the risk? This condition is more likely to start at an earlier age if you have certain medical conditions or have undergone treatments, including: A tumor of the pituitary gland in the brain. A disease that affects the ovaries and hormone production. Certain cancer treatments, such as chemotherapy or hormone therapy, or radiation therapy on the pelvis. Heavy smoking and excessive alcohol use. Family history of early menopause. What are the signs or symptoms? Perimenopausal changes affect each woman differently. Symptoms of this condition may include: Hot flashes. Irregular menstrual periods. Night sweats. Changes in feelings about sex. This could be a decrease in sex drive or an increased discomfort around your sexuality. Vaginal dryness. Headaches. Mood swings. Depression. Problems sleeping (insomnia). Memory problems or trouble concentrating. Irritability. Tiredness. Weight gain. Anxiety. Trouble getting pregnant. How is this diagnosed? This condition is diagnosed based on your medical history, a physical exam, your age, your menstrual history, and your symptoms. Hormone tests may also be done. How is this treated? In some cases, no treatment is needed. You and your health care provider should make a decision together about whether treatment is necessary. Treatment will be based on your individual condition and preferences. Various treatments are available, such  as: Menopausal hormone therapy (MHT). Medicines to treat specific symptoms. Acupuncture. Vitamin or herbal supplements. Before starting treatment, make sure to let your health care provider know if you have a personal or family history of: Heart disease. Breast cancer. Blood clots. Diabetes. Osteoporosis. Follow these instructions at home: Medicines Take over-the-counter and prescription medicines only as told by your health care provider. Take vitamin supplements only as told by your health care provider. Talk with your health care provider before starting any herbal supplements. Lifestyle  Do not use any products that contain nicotine or tobacco, such as cigarettes, e-cigarettes, and chewing tobacco. If you need help quitting, ask your health care provider. Get at least 30 minutes of physical activity on 5 or more days each week. Eat a balanced diet that includes fresh fruits and vegetables, whole grains, soybeans, eggs, lean meat, and low-fat dairy. Avoid alcoholic and caffeinated beverages, as well as spicy foods. This may help prevent hot flashes. Get 7-8 hours of sleep each night. Dress in layers that can be removed to help you manage hot flashes. Find ways to manage stress, such as deep breathing, meditation, or journaling. General instructions  Keep track of your menstrual periods, including: When they occur. How heavy they are and how long they last. How much time passes between periods. Keep track of your symptoms, noting when they start, how often you have them, and how long they last. Use vaginal lubricants or moisturizers to help with vaginal dryness and improve comfort during sex. You can still become pregnant if you are having irregular periods. Make sure you use contraception during perimenopause if you do not want to get pregnant. Keep all follow-up visits. This is important. This includes any group therapy or counseling. Contact a health care provider if: You  have  heavy vaginal bleeding or pass blood clots. Your period lasts more than 2 days longer than normal. Your periods are recurring sooner than 21 days. You bleed after having sex. You have pain during sex. Get help right away if you have: Chest pain, trouble breathing, or trouble talking. Severe depression. Pain when you urinate. Severe headaches. Vision problems. Summary Perimenopause is the time when a woman's body begins to move into menopause. This may happen naturally or as a result of other health problems or medical treatments. Perimenopause can begin 2-8 years before menopause, and it can last for several years. Perimenopausal symptoms can be managed through medicines, lifestyle changes, and complementary therapies such as acupuncture. This information is not intended to replace advice given to you by your health care provider. Make sure you discuss any questions you have with your health care provider. Document Revised: 03/14/2020 Document Reviewed: 03/14/2020 Elsevier Patient Education  2024 [employer].

## 2023-10-01 NOTE — Progress Notes (Signed)
Gynecology Annual Exam  PCP: Dorothyann Peng, MD  Chief Complaint:  Chief Complaint  Patient presents with   Annual Exam    Spotting all the time with nexplanon, what can she do to stop the bleeding?    History of Present Illness: Patient is a 47 y.o. [id number] presents for annual exam. The patient has complaint today of daily spotting in addition to having regular heavy periods for the past 9 months. The spotting is usually brown. The bleeding increases after intercourse. She has a concern about taking estrogen due to hormonal headache history, however, she would like to try and see if that helps to balance the hormones and stop the spotting.  LMP: Patient's last menstrual period was 09/15/2023 (exact date). Average Interval: regular, 28 days Duration of flow:  7-14  days Heavy Menses: yes Clots: no Intermenstrual Bleeding: yes Postcoital Bleeding: yes Dysmenorrhea: no   The patient is sexually active. She currently uses Nexplanon for contraception. She denies dyspareunia.  The patient does perform self breast exams.  There is no notable family history of breast or ovarian cancer in her family.  The patient wears seatbelts: yes.   The patient has regular exercise:  she has a sedentary job and limited physical activity otherwise, she reports a healthy diet, she doesn't drink enough water and she has difficulty staying asleep .    The patient denies current symptoms of depression.    Review of Systems: Review of Systems  Constitutional:  Negative for chills and fever.  HENT:  Negative for congestion, ear discharge, ear pain, hearing loss, sinus pain and sore throat.   Eyes:  Negative for blurred vision and double vision.  Respiratory:  Negative for cough, shortness of breath and wheezing.   Cardiovascular:  Negative for chest pain, palpitations and leg swelling.  Gastrointestinal:  Negative for abdominal pain, blood in stool, constipation, diarrhea, heartburn, melena, nausea and  vomiting.  Genitourinary:  Negative for dysuria, flank pain, frequency, hematuria and urgency.       Positive for BV odor sometimes- not currently  Musculoskeletal:  Negative for back pain, joint pain and myalgias.  Skin:  Negative for itching and rash.  Neurological:  Negative for dizziness, tingling, tremors, sensory change, speech change, focal weakness, seizures, loss of consciousness, weakness and headaches.  Endo/Heme/Allergies:  Negative for environmental allergies. Does not bruise/bleed easily.       Positive for daily spotting and heavy periods  Psychiatric/Behavioral:  Negative for depression, hallucinations, memory loss, substance abuse and suicidal ideas. The patient is not nervous/anxious and does not have insomnia.     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  Last Pap: 2018 Results were:  no abnormalities  Last mammogram: 11/10/2021 Results were: Elby Showers I  Obstetric History: [id number]  Family History:  Family History  Problem Relation Age of Onset   Diabetes Mother     Hypertension Mother    Hyperlipidemia Mother    Cataracts Mother    Obesity Mother    Hyperlipidemia Father    Hypertension Father    Prostate cancer Father    Diabetes Father     Social History:  Social History   Socioeconomic History   Marital status: Married    Spouse name: Not on file   Number of children: Not on file   Years of education: Not on file   Highest education level: Not on file  Occupational History   Not on file  Tobacco Use   Smoking status: Never   Smokeless tobacco: Never  Vaping Use   Vaping status: Never Used  Substance and Sexual Activity   Alcohol use: Never   Drug use: No   Sexual activity: Yes    Birth control/protection: Implant  Other Topics Concern   Not on file  Social History Narrative   Not on file   Social Drivers of Health   Financial Resource Strain: Not on file  Food Insecurity: Not on file  Transportation Needs: Not on file  Physical Activity: Not on file  Stress: Not on file  Social Connections: Not on file  Intimate Partner Violence: Not on file    Allergies:  No Known Allergies  Medications: Prior to Admission medications   Medication Sig Start Date End Date Taking? Authorizing Provider  amLODipine (NORVASC) 2.5 MG tablet TAKE 1 TABLET BY MOUTH EVERY DAY 07/02/23  Yes Dorothyann Peng, MD  Carbomer 934P POWD Place vaginally. 08/19/18  Yes [provider]  clobetasol cream (TEMOVATE) 0.05 % Apply topically 2 (two) times daily. 05/22/23  Yes [provider]  estradiol (ESTRACE) 0.5 MG tablet Take 1 tablet (0.5 mg total) by mouth daily. 10/01/23  Yes Tresea Mall, CNM  etonogestrel (NEXPLANON) 68 MG IMPL implant Inject 1 each into the skin once. Left arm 08/08/21 08/08/24 Yes [provider]  losartan-hydrochlorothiazide (HYZAAR) 50-12.5 MG tablet Take 1 tablet by mouth daily. 03/29/23 03/28/24 Yes Dorothyann Peng, MD  Multiple Vitamin (MULTIVITAMIN) tablet Take 1 tablet by mouth daily.   Yes  [provider]  metoprolol succinate (TOPROL-XL) 25 MG 24 hr tablet TAKE 1 TABLET (25 MG TOTAL) BY MOUTH DAILY. 09/21/22 09/21/23  Dorothyann Peng, MD    Physical Exam Vitals: Blood pressure 110/78, pulse 86, height 5\' 6"  (1.676 m), weight 159 lb (72.1 kg), last menstrual period 09/15/2023.  General: NAD HEENT: normocephalic, anicteric Thyroid: no enlargement, no palpable nodules Pulmonary: No increased work of breathing, CTAB Cardiovascular: RRR, distal pulses 2+ Breast: Breast symmetrical, no tenderness, no palpable nodules or masses, no skin or nipple retraction present, no nipple discharge.  No axillary or supraclavicular lymphadenopathy. Abdomen: NABS, soft, non-tender, non-distended.  Umbilicus without lesions.  No hepatomegaly, splenomegaly or masses palpable. No evidence of hernia  Genitourinary:  External: Normal external female genitalia.  Normal urethral meatus, normal Bartholin's and Skene's glands.    Vagina: Normal vaginal mucosa, no evidence of prolapse.    Cervix: Grossly normal in appearance, no bleeding  Uterus: Non-enlarged, mobile, normal contour.  No CMT  Adnexa: ovaries non-enlarged, no adnexal masses  Rectal: deferred  Lymphatic: no evidence of inguinal lymphadenopathy Extremities:  no edema, erythema, or tenderness Neurologic: Grossly intact Psychiatric: mood appropriate, affect full   Assessment: 47 y.o. [id number] routine annual exam  Plan: Problem List Items Addressed This Visit   None Visit Diagnoses       Well woman exam with routine gynecological exam    -  Primary   Relevant Medications   estradiol (ESTRACE) 0.5 MG tablet   Other Relevant Orders   Cytology - PAP   Cervicovaginal ancillary only     Screening for cervical cancer       Relevant Orders   Cytology - PAP     Screen for sexually transmitted diseases       Relevant Orders   Cervicovaginal ancillary only     Vaginal odor       Relevant Orders   Cervicovaginal ancillary only      Metrorrhagia       Relevant Medications   estradiol (ESTRACE) 0.5 MG tablet       1) Mammogram - recommend yearly screening mammogram.  Mammogram  was ordered by PCP  2) STI screening  was offered and accepted  3) ASCCP guidelines and rationale discussed.  Patient opts for every 5 years screening interval. PAP today  4) Contraception - the patient is currently using  Nexplanon.  She is interested in adding estrogen in pill form for hormone balancing due to daily spotting in addition to regular heavy cycles.  5) Colonoscopy done in 2022-- Screening recommended starting at age 33 for average risk individuals, age 79 for individuals deemed at increased risk (including African Americans) and recommended to continue until age 23.  For patient age 34-85 individualized approach is recommended.  Gold standard screening is via colonoscopy, Cologuard screening is an acceptable alternative for patient unwilling or unable to undergo colonoscopy.  "Colorectal cancer screening for average?risk adults: 2018 guideline update from the American Cancer Society"CA: A Cancer Journal for Clinicians: Mar 10, 2017   6) Routine healthcare maintenance including cholesterol, diabetes screening discussed managed by PCP  7) Increase healthy lifestyle; hydration, exercise, exposure to sunlight  8) Return in about 1 year (around 09/30/2024) for annual established gyn.   Tresea Mall, CNM [facility] 10/01/2023 12:02 PM

## 2023-10-05 LAB — CERVICOVAGINAL ANCILLARY ONLY
Bacterial Vaginitis (gardnerella): NEGATIVE
Candida Glabrata: NEGATIVE
Candida Vaginitis: NEGATIVE
Chlamydia: NEGATIVE
Comment: NEGATIVE
Comment: NEGATIVE
Comment: NEGATIVE
Comment: NEGATIVE
Comment: NEGATIVE
Comment: NORMAL
Neisseria Gonorrhea: NEGATIVE
Trichomonas: NEGATIVE

## 2023-10-05 LAB — CYTOLOGY - PAP
Adequacy: ABSENT
Comment: NEGATIVE
Diagnosis: NEGATIVE
High risk HPV: NEGATIVE

## 2023-10-07 ENCOUNTER — Encounter: Payer: Self-pay | Admitting: Advanced Practice Midwife

## 2023-10-12 ENCOUNTER — Other Ambulatory Visit: Payer: Self-pay | Admitting: Internal Medicine

## 2023-10-12 DIAGNOSIS — Z1231 Encounter for screening mammogram for malignant neoplasm of breast: Secondary | ICD-10-CM

## 2023-10-24 ENCOUNTER — Other Ambulatory Visit: Payer: Self-pay | Admitting: Advanced Practice Midwife

## 2023-10-24 DIAGNOSIS — Z01419 Encounter for gynecological examination (general) (routine) without abnormal findings: Secondary | ICD-10-CM

## 2023-10-24 DIAGNOSIS — N921 Excessive and frequent menstruation with irregular cycle: Secondary | ICD-10-CM

## 2023-11-06 DIAGNOSIS — S76011A Strain of muscle, fascia and tendon of right hip, initial encounter: Secondary | ICD-10-CM | POA: Diagnosis not present

## 2023-11-12 ENCOUNTER — Ambulatory Visit
Admission: RE | Admit: 2023-11-12 | Discharge: 2023-11-12 | Disposition: A | Discharge status: Home / Self Care | Payer: BC Managed Care – PPO | Source: Ambulatory Visit

## 2023-11-12 DIAGNOSIS — Z1231 Encounter for screening mammogram for malignant neoplasm of breast: Secondary | ICD-10-CM

## 2023-11-17 DIAGNOSIS — S76011A Strain of muscle, fascia and tendon of right hip, initial encounter: Secondary | ICD-10-CM | POA: Diagnosis not present

## 2023-11-17 DIAGNOSIS — M545 Low back pain, unspecified: Secondary | ICD-10-CM | POA: Diagnosis not present

## 2023-11-22 DIAGNOSIS — H59812 Chorioretinal scars after surgery for detachment, left eye: Secondary | ICD-10-CM | POA: Diagnosis not present

## 2023-11-22 DIAGNOSIS — E113591 Type 2 diabetes mellitus with proliferative diabetic retinopathy without macular edema, right eye: Secondary | ICD-10-CM | POA: Diagnosis not present

## 2023-11-22 DIAGNOSIS — E113512 Type 2 diabetes mellitus with proliferative diabetic retinopathy with macular edema, left eye: Secondary | ICD-10-CM | POA: Diagnosis not present

## 2023-11-22 DIAGNOSIS — H35372 Puckering of macula, left eye: Secondary | ICD-10-CM | POA: Diagnosis not present

## 2023-12-03 DIAGNOSIS — M25551 Pain in right hip: Secondary | ICD-10-CM | POA: Diagnosis not present

## 2023-12-11 ENCOUNTER — Other Ambulatory Visit: Payer: Self-pay | Admitting: Internal Medicine

## 2023-12-14 MED ORDER — METOPROLOL SUCCINATE ER 25 MG PO TB24: 25.0000 mg | ORAL_TABLET | Freq: Every day | ORAL | 3 refills | Status: DC

## 2023-12-29 ENCOUNTER — Encounter: Payer: Self-pay | Admitting: Internal Medicine

## 2024-01-17 DIAGNOSIS — H524 Presbyopia: Secondary | ICD-10-CM | POA: Diagnosis not present

## 2024-01-17 DIAGNOSIS — D3131 Benign neoplasm of right choroid: Secondary | ICD-10-CM | POA: Diagnosis not present

## 2024-01-20 DIAGNOSIS — F4323 Adjustment disorder with mixed anxiety and depressed mood: Secondary | ICD-10-CM | POA: Diagnosis not present

## 2024-02-04 DIAGNOSIS — F4323 Adjustment disorder with mixed anxiety and depressed mood: Secondary | ICD-10-CM | POA: Diagnosis not present

## 2024-02-10 DIAGNOSIS — F4323 Adjustment disorder with mixed anxiety and depressed mood: Secondary | ICD-10-CM | POA: Diagnosis not present

## 2024-02-17 DIAGNOSIS — F4323 Adjustment disorder with mixed anxiety and depressed mood: Secondary | ICD-10-CM | POA: Diagnosis not present

## 2024-02-24 DIAGNOSIS — F4323 Adjustment disorder with mixed anxiety and depressed mood: Secondary | ICD-10-CM | POA: Diagnosis not present

## 2024-02-24 DIAGNOSIS — D3131 Benign neoplasm of right choroid: Secondary | ICD-10-CM | POA: Diagnosis not present

## 2024-03-09 DIAGNOSIS — F4323 Adjustment disorder with mixed anxiety and depressed mood: Secondary | ICD-10-CM | POA: Diagnosis not present

## 2024-03-15 ENCOUNTER — Ambulatory Visit (INDEPENDENT_AMBULATORY_CARE_PROVIDER_SITE_OTHER): Payer: BC Managed Care – PPO | Admitting: Internal Medicine

## 2024-03-15 ENCOUNTER — Encounter: Payer: Self-pay | Admitting: Internal Medicine

## 2024-03-15 VITALS — BP 114/70 | HR 77 | Temp 98.4°F | Ht 66.0 in | Wt 160.4 lb

## 2024-03-15 DIAGNOSIS — I10 Essential (primary) hypertension: Secondary | ICD-10-CM | POA: Diagnosis not present

## 2024-03-15 DIAGNOSIS — Z8249 Family history of ischemic heart disease and other diseases of the circulatory system: Secondary | ICD-10-CM

## 2024-03-15 DIAGNOSIS — Z87898 Personal history of other specified conditions: Secondary | ICD-10-CM

## 2024-03-15 DIAGNOSIS — N926 Irregular menstruation, unspecified: Secondary | ICD-10-CM

## 2024-03-15 MED ORDER — SCOPOLAMINE 1 MG/3DAYS TD PT72: 1.0000 | MEDICATED_PATCH | TRANSDERMAL | 0 refills | Status: AC

## 2024-03-15 MED ORDER — LOSARTAN POTASSIUM-HCTZ 50-12.5 MG PO TABS: 1.0000 | ORAL_TABLET | Freq: Every day | ORAL | 1 refills | Status: DC

## 2024-03-15 NOTE — Progress Notes (Signed)
 I,Cheryl Hill, CMA,acting as a Neurosurgeon for Cheryl Dung, MD.,have documented all relevant documentation on the behalf of Cheryl Dung, MD,as directed by  Cheryl Dung, MD while in the presence of Cheryl Dung, MD.  Subjective:  Patient ID: Cheryl Hill , female    DOB: 1976/01/09 , 48 y.o.   MRN: [id number]  Chief Complaint  Patient presents with   Hypertension    Patient presents today for bpc. She reports compliance with medications. Denies headache, chest pain & sob. She has no specific questions or concerns.     HPI Discussed the use of AI scribe software for clinical note transcription with the patient, who gave verbal consent to proceed.  History of Present Illness [name] is a 48 year old female with hypertension who presents for a blood pressure check.  She has been monitoring her blood pressure at home, noting inconsistent readings. Her blood pressure remains controlled as long as she adheres to her medication regimen.  Her current medications include metoprolol  25 mg daily in the morning, losartan  50/12.5 mg, and amlodipine  at night. She previously experienced headaches with amlodipine , which led to a dosage reduction. Additionally, she takes oral estrogen per GYN, which was recently started to manage erratic and prolonged menstrual cycles. This has been effective in alleviating her symptoms.  She experiences motion sickness, particularly on smaller boats, and plans to use a patch for an upcoming cruise to [place] in July. She has previously used over-the-counter medications like Dramamine for motion sickness.   Hypertension This is a chronic problem. The current episode started more than 1 year ago. The problem has been gradually improving since onset. The problem is controlled. Pertinent negatives include no blurred vision, chest pain, palpitations or shortness of breath. Past treatments include angiotensin blockers. The current treatment  provides moderate improvement.     Past Medical History:  Diagnosis Date   Atopic dermatitis 11/10/2016   Hypertension      Family History  Problem Relation Age of Onset   Diabetes Mother    Hypertension Mother    Hyperlipidemia Mother    Cataracts Mother    Obesity Mother    Hyperlipidemia Father    Hypertension Father    Prostate cancer Father    Diabetes Father      Current Outpatient Medications:    amLODipine  (NORVASC ) 2.5 MG tablet, TAKE 1 TABLET BY MOUTH EVERY DAY, Disp: 90 tablet, Rfl: 3   Carbomer 934P POWD, Place vaginally., Disp: , Rfl:    clobetasol cream (TEMOVATE) 0.05 %, Apply topically 2 (two) times daily., Disp: , Rfl:    estradiol  (ESTRACE ) 0.5 MG tablet, TAKE 1 TABLET BY MOUTH EVERY DAY, Disp: 90 tablet, Rfl: 4   etonogestrel  (NEXPLANON ) 68 MG IMPL implant, Inject 1 each into the skin once. Left arm, Disp: , Rfl:    metoprolol  succinate (TOPROL -XL) 25 MG 24 hr tablet, Take 1 tablet (25 mg total) by mouth daily., Disp: 90 tablet, Rfl: 3   Multiple Vitamin (MULTIVITAMIN) tablet, Take 1 tablet by mouth daily., Disp: , Rfl:    scopolamine  (TRANSDERM SCOP , 1.5 MG,) 1 MG/3DAYS, Place 1 patch (1.5 mg total) onto the skin every 3 (three) days., Disp: 4 patch, Rfl: 0   losartan -hydrochlorothiazide  (HYZAAR) 50-12.5 MG tablet, Take 1 tablet by mouth daily., Disp: 90 tablet, Rfl: 1   No Known Allergies   Review of Systems  Constitutional: Negative.   Eyes:  Negative for blurred vision.  Respiratory: Negative.  Negative for shortness of breath.   Cardiovascular: Negative.  Negative for chest pain and palpitations.  Gastrointestinal: Negative.   Neurological: Negative.   Psychiatric/Behavioral: Negative.       Today's Vitals   03/15/24 0918  BP: 114/70  Pulse: 77  Temp: 98.4 F (36.9 C)  SpO2: 98%  Weight: 160 lb 6.4 oz (72.8 kg)  Height: 5\' 6"  (1.676 m)   Body mass index is 25.89 kg/m.  Wt Readings from Last 3 Encounters:  03/15/24 160 lb 6.4 oz (72.8  kg)  10/01/23 159 lb (72.1 kg)  09/15/23 162 lb (73.5 kg)    BP Readings from Last 3 Encounters:  03/15/24 114/70  10/01/23 110/78  09/15/23 122/80     Objective:  Physical Exam Vitals and nursing note reviewed.  Constitutional:      Appearance: Normal appearance.  HENT:     Head: Normocephalic and atraumatic.  Eyes:     Extraocular Movements: Extraocular movements intact.  Cardiovascular:     Rate and Rhythm: Normal rate and regular rhythm.     Heart sounds: Normal heart sounds.  Pulmonary:     Effort: Pulmonary effort is normal.     Breath sounds: Normal breath sounds.  Musculoskeletal:     Cervical back: Normal range of motion.  Skin:    General: Skin is warm.  Neurological:     General: No focal deficit present.     Mental Status: She is alert.  Psychiatric:        Mood and Affect: Mood normal.        Behavior: Behavior normal.         Assessment And Plan:  Essential hypertension, benign Assessment & Plan: Blood pressure controlled with current regimen. Headaches from amlodipine  managed by dose reduction. Inconsistent home monitoring noted. - Continue metoprolol  25 mg daily in the morning. - Continue losartan  50/12.5 mg. - Continue amlodipine  at current dose. - Refill losartan  prescription. - Encourage consistent home blood pressure monitoring.  Orders: -     CMP14+EGFR -     Losartan  Potassium-HCTZ; Take 1 tablet by mouth daily.  Dispense: 90 tablet; Refill: 1  Irregular menses Assessment & Plan: Low-dose oral estrogen effective for symptom management. No fresh bleeding reported. - Continue oral estrogen therapy. - Continue management as per GYN   History of motion sickness Assessment & Plan: Experiences motion sickness, especially on smaller boats. Planning a cruise in July. - Prescribe scopolamine  patch for motion sickness. - Instruct to apply patch behind the ear one hour before travel and change every three days.  Orders: -     Scopolamine ;  Place 1 patch (1.5 mg total) onto the skin every 3 (three) days.  Dispense: 4 patch; Refill: 0  Family history of heart disease -     Lipoprotein A (LPA)  General Health Maintenance Up to date with mammogram and colonoscopy. Tetanus vaccination current.   Return if symptoms worsen or fail to improve.  Patient was given opportunity to ask questions. Patient verbalized understanding of the plan and was able to repeat key elements of the plan. All questions were answered to their satisfaction.    I, Cheryl Dung, MD, have reviewed all documentation for this visit. The documentation on 03/15/24 for the exam, diagnosis, procedures, and orders are all accurate and complete.   IF YOU HAVE BEEN REFERRED TO A SPECIALIST, IT MAY TAKE 1-2 WEEKS TO SCHEDULE/PROCESS THE REFERRAL. IF YOU HAVE NOT HEARD FROM US /SPECIALIST IN TWO WEEKS, PLEASE GIVE US   A CALL AT [phone] X [phone].   THE PATIENT IS ENCOURAGED TO PRACTICE SOCIAL DISTANCING DUE TO THE COVID-19 PANDEMIC.

## 2024-03-15 NOTE — Patient Instructions (Signed)
 Hypertension, Adult Hypertension is another name for high blood pressure. High blood pressure forces your heart to work harder to pump blood. This can cause problems over time. There are two numbers in a blood pressure reading. There is a top number (systolic) over a bottom number (diastolic). It is best to have a blood pressure that is below 120/80. What are the causes? The cause of this condition is not known. Some other conditions can lead to high blood pressure. What increases the risk? Some lifestyle factors can make you more likely to develop high blood pressure: Smoking. Not getting enough exercise or physical activity. Being overweight. Having too much fat, sugar, calories, or salt (sodium) in your diet. Drinking too much alcohol. Other risk factors include: Having any of these conditions: Heart disease. Diabetes. High cholesterol. Kidney disease. Obstructive sleep apnea. Having a family history of high blood pressure and high cholesterol. Age. The risk increases with age. Stress. What are the signs or symptoms? High blood pressure may not cause symptoms. Very high blood pressure (hypertensive crisis) may cause: Headache. Fast or uneven heartbeats (palpitations). Shortness of breath. Nosebleed. Vomiting or feeling like you may vomit (nauseous). Changes in how you see. Very bad chest pain. Feeling dizzy. Seizures. How is this treated? This condition is treated by making healthy lifestyle changes, such as: Eating healthy foods. Exercising more. Drinking less alcohol. Your doctor may prescribe medicine if lifestyle changes do not help enough and if: Your top number is above 130. Your bottom number is above 80. Your personal target blood pressure may vary. Follow these instructions at home: Eating and drinking  If told, follow the DASH eating plan. To follow this plan: Fill one half of your plate at each meal with fruits and vegetables. Fill one fourth of your plate  at each meal with whole grains. Whole grains include whole-wheat pasta, brown rice, and whole-grain bread. Eat or drink low-fat dairy products, such as skim milk or low-fat yogurt. Fill one fourth of your plate at each meal with low-fat (lean) proteins. Low-fat proteins include fish, chicken without skin, eggs, beans, and tofu. Avoid fatty meat, cured and processed meat, or chicken with skin. Avoid pre-made or processed food. Limit the amount of salt in your diet to less than 1,500 mg each day. Do not drink alcohol if: Your doctor tells you not to drink. You are pregnant, may be pregnant, or are planning to become pregnant. If you drink alcohol: Limit how much you have to: 0-1 drink a day for women. 0-2 drinks a day for men. Know how much alcohol is in your drink. In the [place] one drink equals one 12 oz bottle of beer (355 mL), one 5 oz glass of wine (148 mL), or one 1 oz glass of hard liquor (44 mL). Lifestyle  Work with your doctor to stay at a healthy weight or to lose weight. Ask your doctor what the best weight is for you. Get at least 30 minutes of exercise that causes your heart to beat faster (aerobic exercise) most days of the week. This may include walking, swimming, or biking. Get at least 30 minutes of exercise that strengthens your muscles (resistance exercise) at least 3 days a week. This may include lifting weights or doing Pilates. Do not smoke or use any products that contain nicotine or tobacco. If you need help quitting, ask your doctor. Check your blood pressure at home as told by your doctor. Keep all follow-up visits. Medicines Take over-the-counter and prescription medicines  only as told by your doctor. Follow directions carefully. Do not skip doses of blood pressure medicine. The medicine does not work as well if you skip doses. Skipping doses also puts you at risk for problems. Ask your doctor about side effects or reactions to medicines that you should watch  for. Contact a doctor if: You think you are having a reaction to the medicine you are taking. You have headaches that keep coming back. You feel dizzy. You have swelling in your ankles. You have trouble with your vision. Get help right away if: You get a very bad headache. You start to feel mixed up (confused). You feel weak or numb. You feel faint. You have very bad pain in your: Chest. Belly (abdomen). You vomit more than once. You have trouble breathing. These symptoms may be an emergency. Get help right away. Call 911. Do not wait to see if the symptoms will go away. Do not drive yourself to the hospital. Summary Hypertension is another name for high blood pressure. High blood pressure forces your heart to work harder to pump blood. For most people, a normal blood pressure is less than 120/80. Making healthy choices can help lower blood pressure. If your blood pressure does not get lower with healthy choices, you may need to take medicine. This information is not intended to replace advice given to you by your health care provider. Make sure you discuss any questions you have with your health care provider. Document Revised: 07/17/2021 Document Reviewed: 07/17/2021 Elsevier Patient Education  2024 [employer].

## 2024-03-16 ENCOUNTER — Ambulatory Visit: Payer: Self-pay | Admitting: Internal Medicine

## 2024-03-16 LAB — CMP14+EGFR
ALT: 21 IU/L (ref 0–32)
AST: 22 IU/L (ref 0–40)
Albumin: 4.5 g/dL (ref 3.9–4.9)
Alkaline Phosphatase: 48 IU/L (ref 44–121)
BUN/Creatinine Ratio: 14 (ref 9–23)
BUN: 13 mg/dL (ref 6–24)
Bilirubin Total: 0.4 mg/dL (ref 0.0–1.2)
CO2: 22 mmol/L (ref 20–29)
Calcium: 10.1 mg/dL (ref 8.7–10.2)
Chloride: 101 mmol/L (ref 96–106)
Creatinine, Ser: 0.9 mg/dL (ref 0.57–1.00)
Globulin, Total: 2.8 g/dL (ref 1.5–4.5)
Glucose: 75 mg/dL (ref 70–99)
Potassium: 4.5 mmol/L (ref 3.5–5.2)
Sodium: 141 mmol/L (ref 134–144)
Total Protein: 7.3 g/dL (ref 6.0–8.5)
eGFR: 79 mL/min/{1.73_m2} (ref >59)

## 2024-03-16 LAB — LIPOPROTEIN A (LPA): Lipoprotein (a): 69.1 nmol/L (ref <75.0)

## 2024-03-17 DIAGNOSIS — F4323 Adjustment disorder with mixed anxiety and depressed mood: Secondary | ICD-10-CM | POA: Diagnosis not present

## 2024-03-18 DIAGNOSIS — N951 Menopausal and female climacteric states: Secondary | ICD-10-CM | POA: Insufficient documentation

## 2024-03-18 DIAGNOSIS — Z87898 Personal history of other specified conditions: Secondary | ICD-10-CM | POA: Insufficient documentation

## 2024-03-18 NOTE — Assessment & Plan Note (Signed)
 Low-dose oral estrogen effective for symptom management. No fresh bleeding reported. - Continue oral estrogen therapy. - Continue management as per GYN

## 2024-03-18 NOTE — Assessment & Plan Note (Signed)
 Blood pressure controlled with current regimen. Headaches from amlodipine  managed by dose reduction. Inconsistent home monitoring noted. - Continue metoprolol  25 mg daily in the morning. - Continue losartan  50/12.5 mg. - Continue amlodipine  at current dose. - Refill losartan  prescription. - Encourage consistent home blood pressure monitoring.

## 2024-03-18 NOTE — Assessment & Plan Note (Signed)
 Experiences motion sickness, especially on smaller boats. Planning a cruise in July. - Prescribe scopolamine  patch for motion sickness. - Instruct to apply patch behind the ear one hour before travel and change every three days.

## 2024-03-23 DIAGNOSIS — F4323 Adjustment disorder with mixed anxiety and depressed mood: Secondary | ICD-10-CM | POA: Diagnosis not present

## 2024-03-30 DIAGNOSIS — F4323 Adjustment disorder with mixed anxiety and depressed mood: Secondary | ICD-10-CM | POA: Diagnosis not present

## 2024-04-06 DIAGNOSIS — F4323 Adjustment disorder with mixed anxiety and depressed mood: Secondary | ICD-10-CM | POA: Diagnosis not present

## 2024-04-21 DIAGNOSIS — F4323 Adjustment disorder with mixed anxiety and depressed mood: Secondary | ICD-10-CM | POA: Diagnosis not present

## 2024-04-25 DIAGNOSIS — F4323 Adjustment disorder with mixed anxiety and depressed mood: Secondary | ICD-10-CM | POA: Diagnosis not present

## 2024-05-12 DIAGNOSIS — F4323 Adjustment disorder with mixed anxiety and depressed mood: Secondary | ICD-10-CM | POA: Diagnosis not present

## 2024-05-17 DIAGNOSIS — I10 Essential (primary) hypertension: Secondary | ICD-10-CM | POA: Diagnosis not present

## 2024-05-17 DIAGNOSIS — E119 Type 2 diabetes mellitus without complications: Secondary | ICD-10-CM | POA: Diagnosis not present

## 2024-05-17 DIAGNOSIS — L309 Dermatitis, unspecified: Secondary | ICD-10-CM | POA: Diagnosis not present

## 2024-05-17 DIAGNOSIS — Z794 Long term (current) use of insulin: Secondary | ICD-10-CM | POA: Diagnosis not present

## 2024-05-17 DIAGNOSIS — Z1330 Encounter for screening examination for mental health and behavioral disorders, unspecified: Secondary | ICD-10-CM | POA: Diagnosis not present

## 2024-05-17 DIAGNOSIS — T7849XA Other allergy, initial encounter: Secondary | ICD-10-CM | POA: Diagnosis not present

## 2024-06-01 DIAGNOSIS — F4323 Adjustment disorder with mixed anxiety and depressed mood: Secondary | ICD-10-CM | POA: Diagnosis not present

## 2024-06-08 DIAGNOSIS — F4323 Adjustment disorder with mixed anxiety and depressed mood: Secondary | ICD-10-CM | POA: Diagnosis not present

## 2024-06-16 DIAGNOSIS — F4323 Adjustment disorder with mixed anxiety and depressed mood: Secondary | ICD-10-CM | POA: Diagnosis not present

## 2024-06-26 ENCOUNTER — Other Ambulatory Visit: Payer: Self-pay | Admitting: Internal Medicine

## 2024-06-26 DIAGNOSIS — I10 Essential (primary) hypertension: Secondary | ICD-10-CM

## 2024-07-05 DIAGNOSIS — E113512 Type 2 diabetes mellitus with proliferative diabetic retinopathy with macular edema, left eye: Secondary | ICD-10-CM | POA: Diagnosis not present

## 2024-07-05 DIAGNOSIS — H2512 Age-related nuclear cataract, left eye: Secondary | ICD-10-CM | POA: Diagnosis not present

## 2024-07-05 DIAGNOSIS — H59812 Chorioretinal scars after surgery for detachment, left eye: Secondary | ICD-10-CM | POA: Diagnosis not present

## 2024-07-05 DIAGNOSIS — E113591 Type 2 diabetes mellitus with proliferative diabetic retinopathy without macular edema, right eye: Secondary | ICD-10-CM | POA: Diagnosis not present

## 2024-08-07 DIAGNOSIS — F4323 Adjustment disorder with mixed anxiety and depressed mood: Secondary | ICD-10-CM | POA: Diagnosis not present

## 2024-08-29 DIAGNOSIS — F4323 Adjustment disorder with mixed anxiety and depressed mood: Secondary | ICD-10-CM | POA: Diagnosis not present

## 2024-09-15 DIAGNOSIS — F4323 Adjustment disorder with mixed anxiety and depressed mood: Secondary | ICD-10-CM | POA: Diagnosis not present

## 2024-09-19 ENCOUNTER — Encounter: Payer: BC Managed Care – PPO | Admitting: Internal Medicine

## 2024-09-19 NOTE — Progress Notes (Signed)
 Alyssa Norman                                          MRN: [id number]   09/19/2024   The [facility] Quality Team Specialist reviewed this patient medical record for the purposes of chart review for care gap closure. The following were reviewed: chart review for care gap closure-controlling blood pressure.    [facility] Quality Team

## 2024-09-21 ENCOUNTER — Other Ambulatory Visit: Payer: Self-pay | Admitting: Internal Medicine

## 2024-09-27 ENCOUNTER — Ambulatory Visit: Payer: Self-pay | Admitting: Internal Medicine

## 2024-09-27 ENCOUNTER — Encounter: Payer: Self-pay | Admitting: Internal Medicine

## 2024-09-27 VITALS — BP 110/70 | HR 90 | Temp 98.1°F | Ht 66.0 in | Wt 164.2 lb

## 2024-09-27 DIAGNOSIS — I10 Essential (primary) hypertension: Secondary | ICD-10-CM

## 2024-09-27 DIAGNOSIS — Z23 Encounter for immunization: Secondary | ICD-10-CM | POA: Diagnosis not present

## 2024-09-27 LAB — CMP14+EGFR
ALT: 26 IU/L (ref 0–32)
AST: 22 IU/L (ref 0–40)
Albumin: 4.6 g/dL (ref 3.9–4.9)
Alkaline Phosphatase: 48 IU/L (ref 41–116)
BUN/Creatinine Ratio: 15 (ref 9–23)
BUN: 15 mg/dL (ref 6–24)
Bilirubin Total: 0.3 mg/dL (ref 0.0–1.2)
CO2: 26 mmol/L (ref 20–29)
Calcium: 10.1 mg/dL (ref 8.7–10.2)
Chloride: 98 mmol/L (ref 96–106)
Creatinine, Ser: 0.98 mg/dL (ref 0.57–1.00)
Globulin, Total: 2.6 g/dL (ref 1.5–4.5)
Glucose: 69 mg/dL — ABNORMAL LOW (ref 70–99)
Potassium: 4.9 mmol/L (ref 3.5–5.2)
Sodium: 140 mmol/L (ref 134–144)
Total Protein: 7.2 g/dL (ref 6.0–8.5)
eGFR: 71 mL/min/1.73 (ref >59)

## 2024-09-27 MED ORDER — LOSARTAN POTASSIUM-HCTZ 50-12.5 MG PO TABS: 1.0000 | ORAL_TABLET | Freq: Every day | ORAL | 1 refills | Status: AC

## 2024-09-27 NOTE — Patient Instructions (Signed)
 Hypertension, Adult Hypertension is another name for high blood pressure. High blood pressure forces your heart to work harder to pump blood. This can cause problems over time. There are two numbers in a blood pressure reading. There is a top number (systolic) over a bottom number (diastolic). It is best to have a blood pressure that is below 120/80. What are the causes? The cause of this condition is not known. Some other conditions can lead to high blood pressure. What increases the risk? Some lifestyle factors can make you more likely to develop high blood pressure: Smoking. Not getting enough exercise or physical activity. Being overweight. Having too much fat, sugar, calories, or salt (sodium) in your diet. Drinking too much alcohol. Other risk factors include: Having any of these conditions: Heart disease. Diabetes. High cholesterol. Kidney disease. Obstructive sleep apnea. Having a family history of high blood pressure and high cholesterol. Age. The risk increases with age. Stress. What are the signs or symptoms? High blood pressure may not cause symptoms. Very high blood pressure (hypertensive crisis) may cause: Headache. Fast or uneven heartbeats (palpitations). Shortness of breath. Nosebleed. Vomiting or feeling like you may vomit (nauseous). Changes in how you see. Very bad chest pain. Feeling dizzy. Seizures. How is this treated? This condition is treated by making healthy lifestyle changes, such as: Eating healthy foods. Exercising more. Drinking less alcohol. Your doctor may prescribe medicine if lifestyle changes do not help enough and if: Your top number is above 130. Your bottom number is above 80. Your personal target blood pressure may vary. Follow these instructions at home: Eating and drinking  If told, follow the DASH eating plan. To follow this plan: Fill one half of your plate at each meal with fruits and vegetables. Fill one fourth of your plate  at each meal with whole grains. Whole grains include whole-wheat pasta, brown rice, and whole-grain bread. Eat or drink low-fat dairy products, such as skim milk or low-fat yogurt. Fill one fourth of your plate at each meal with low-fat (lean) proteins. Low-fat proteins include fish, chicken without skin, eggs, beans, and tofu. Avoid fatty meat, cured and processed meat, or chicken with skin. Avoid pre-made or processed food. Limit the amount of salt in your diet to less than 1,500 mg each day. Do not drink alcohol if: Your doctor tells you not to drink. You are pregnant, may be pregnant, or are planning to become pregnant. If you drink alcohol: Limit how much you have to: 0-1 drink a day for women. 0-2 drinks a day for men. Know how much alcohol is in your drink. In the [place] one drink equals one 12 oz bottle of beer (355 mL), one 5 oz glass of wine (148 mL), or one 1 oz glass of hard liquor (44 mL). Lifestyle  Work with your doctor to stay at a healthy weight or to lose weight. Ask your doctor what the best weight is for you. Get at least 30 minutes of exercise that causes your heart to beat faster (aerobic exercise) most days of the week. This may include walking, swimming, or biking. Get at least 30 minutes of exercise that strengthens your muscles (resistance exercise) at least 3 days a week. This may include lifting weights or doing Pilates. Do not smoke or use any products that contain nicotine or tobacco. If you need help quitting, ask your doctor. Check your blood pressure at home as told by your doctor. Keep all follow-up visits. Medicines Take over-the-counter and prescription medicines  only as told by your doctor. Follow directions carefully. Do not skip doses of blood pressure medicine. The medicine does not work as well if you skip doses. Skipping doses also puts you at risk for problems. Ask your doctor about side effects or reactions to medicines that you should watch  for. Contact a doctor if: You think you are having a reaction to the medicine you are taking. You have headaches that keep coming back. You feel dizzy. You have swelling in your ankles. You have trouble with your vision. Get help right away if: You get a very bad headache. You start to feel mixed up (confused). You feel weak or numb. You feel faint. You have very bad pain in your: Chest. Belly (abdomen). You vomit more than once. You have trouble breathing. These symptoms may be an emergency. Get help right away. Call 911. Do not wait to see if the symptoms will go away. Do not drive yourself to the hospital. Summary Hypertension is another name for high blood pressure. High blood pressure forces your heart to work harder to pump blood. For most people, a normal blood pressure is less than 120/80. Making healthy choices can help lower blood pressure. If your blood pressure does not get lower with healthy choices, you may need to take medicine. This information is not intended to replace advice given to you by your health care provider. Make sure you discuss any questions you have with your health care provider. Document Revised: 07/17/2021 Document Reviewed: 07/17/2021 Elsevier Patient Education  2024 [employer].

## 2024-09-27 NOTE — Progress Notes (Signed)
 I,Victoria T Emmitt, CMA,acting as a neurosurgeon for Catheryn LOISE Slocumb, MD.,have documented all relevant documentation on the behalf of Catheryn LOISE Slocumb, MD,as directed by  Catheryn LOISE Slocumb, MD while in the presence of Catheryn LOISE Slocumb, MD.  Subjective:  Patient ID: Cheryl Hill , female    DOB: 10/20/1975 , 48 y.o.   MRN: [id number]  Chief Complaint  Patient presents with   Hypertension    Patient presents for bpc. She reports compliance with medications. Denies headache, chest pain & sob.     HPI Discussed the use of AI scribe software for clinical note transcription with the patient, who gave verbal consent to proceed.  History of Present Illness Cheryl Hill is a 48 year old female with hypertension who presents for a blood pressure check.  She feels generally well but is slightly tired today.  She is currently on amlodipine  2.5 mg, losartan  50/12.5 mg, and metoprolol  for hypertension management. Additionally, she takes estradiol  and progesterone as part of hormone replacement therapy.  She has gained five pounds since last December and wants to increase her physical activity.  She recently traveled to [place], but did not enjoy the resort as it was not on the beach. She is also trying to improve her water intake.   Hypertension This is a chronic problem. The current episode started more than 1 year ago. The problem has been gradually improving since onset. The problem is controlled. Pertinent negatives include no blurred vision, chest pain, palpitations or shortness of breath. Past treatments include angiotensin blockers. The current treatment provides moderate improvement.     Past Medical History:  Diagnosis Date   Atopic dermatitis 11/10/2016   Hypertension      Family History  Problem Relation Age of Onset   Diabetes Mother    Hypertension Mother    Hyperlipidemia Mother    Cataracts Mother    Obesity Mother    Hyperlipidemia Father    Hypertension  Father    Prostate cancer Father    Diabetes Father      Current Outpatient Medications:    amLODipine  (NORVASC ) 2.5 MG tablet, TAKE 1 TABLET BY MOUTH EVERY DAY, Disp: 90 tablet, Rfl: 3   Carbomer 934P POWD, Place vaginally., Disp: , Rfl:    clobetasol cream (TEMOVATE) 0.05 %, Apply topically 2 (two) times daily., Disp: , Rfl:    estradiol  (ESTRACE ) 0.5 MG tablet, TAKE 1 TABLET BY MOUTH EVERY DAY, Disp: 90 tablet, Rfl: 4   etonogestrel  (NEXPLANON ) 68 MG IMPL implant, Inject 1 each into the skin once. Left arm, Disp: , Rfl:    metoprolol  succinate (TOPROL -XL) 25 MG 24 hr tablet, TAKE 1 TABLET (25 MG TOTAL) BY MOUTH DAILY., Disp: 90 tablet, Rfl: 3   Multiple Vitamin (MULTIVITAMIN) tablet, Take 1 tablet by mouth daily., Disp: , Rfl:    scopolamine  (TRANSDERM SCOP , 1.5 MG,) 1 MG/3DAYS, Place 1 patch (1.5 mg total) onto the skin every 3 (three) days., Disp: 4 patch, Rfl: 0   losartan -hydrochlorothiazide  (HYZAAR) 50-12.5 MG tablet, Take 1 tablet by mouth daily., Disp: 90 tablet, Rfl: 1   No Known Allergies   Review of Systems  Constitutional: Negative.   Eyes:  Negative for blurred vision.  Respiratory: Negative.  Negative for shortness of breath.   Cardiovascular: Negative.  Negative for chest pain and palpitations.  Gastrointestinal: Negative.  Negative for abdominal distention.  Neurological: Negative.   Psychiatric/Behavioral: Negative.       Today's Vitals   09/27/24 1412  BP: 110/70  Pulse: 90  Temp: 98.1 F (36.7 C)  SpO2: 98%  Weight: 164 lb 3.2 oz (74.5 kg)  Height: 5' 6 (1.676 m)   Body mass index is 26.5 kg/m.  Wt Readings from Last 3 Encounters:  09/27/24 164 lb 3.2 oz (74.5 kg)  03/15/24 160 lb 6.4 oz (72.8 kg)  10/01/23 159 lb (72.1 kg)    The 10-year ASCVD risk score (Arnett DK, et al., 2019) is: 4.2%   Values used to calculate the score:     Age: 10 years     Clinically relevant sex: Female     Is Non-Hispanic African American: Yes     Diabetic: Yes      Tobacco smoker: No     Systolic Blood Pressure: 110 mmHg     Is BP treated: Yes     HDL Cholesterol: 46 mg/dL     Total Cholesterol: 172 mg/dL  Objective:  Physical Exam Vitals and nursing note reviewed.  Constitutional:      Appearance: Normal appearance.  HENT:     Head: Normocephalic and atraumatic.  Eyes:     Extraocular Movements: Extraocular movements intact.  Cardiovascular:     Rate and Rhythm: Normal rate and regular rhythm.     Heart sounds: Normal heart sounds.  Pulmonary:     Effort: Pulmonary effort is normal.     Breath sounds: Normal breath sounds.  Musculoskeletal:     Cervical back: Normal range of motion.  Skin:    General: Skin is warm.  Neurological:     General: No focal deficit present.     Mental Status: She is alert.  Psychiatric:        Mood and Affect: Mood normal.        Behavior: Behavior normal.         Assessment And Plan:   Assessment & Plan Essential hypertension, benign Chronic, well controlled.  Hypertension managed with amlodipine , losartan , and metoprolol . - Continue amlodipine  2.5 mg daily. - Refilled losartan  50/12.5 mg. - Continue metoprolol  as prescribed. - Ordered kidney function tests. - Follow up in six months. Immunization due She was given a flu vaccine.    Orders Placed This Encounter  Procedures   Flu vaccine trivalent PF, 6mos and older(Flulaval,Afluria,Fluarix,Fluzone)   CMP14+EGFR   Return pls move phys to June 2026.  Patient was given opportunity to ask questions. Patient verbalized understanding of the plan and was able to repeat key elements of the plan. All questions were answered to their satisfaction.   I, Catheryn LOISE Slocumb, MD, have reviewed all documentation for this visit. The documentation on 09/27/2024 for the exam, diagnosis, procedures, and orders are all accurate and complete.   IF YOU HAVE BEEN REFERRED TO A SPECIALIST, IT MAY TAKE 1-2 WEEKS TO SCHEDULE/PROCESS THE REFERRAL. IF YOU HAVE NOT HEARD  FROM US /SPECIALIST IN TWO WEEKS, PLEASE GIVE US  A CALL AT [phone].

## 2024-09-27 NOTE — Assessment & Plan Note (Addendum)
 Chronic, well controlled.  Hypertension managed with amlodipine , losartan , and metoprolol . - Continue amlodipine  2.5 mg daily. - Refilled losartan  50/12.5 mg. - Continue metoprolol  as prescribed. - Ordered kidney function tests. - Follow up in six months.

## 2024-09-28 ENCOUNTER — Ambulatory Visit: Payer: Self-pay | Admitting: Internal Medicine

## 2024-10-23 ENCOUNTER — Other Ambulatory Visit: Payer: Self-pay | Admitting: Internal Medicine

## 2024-10-23 DIAGNOSIS — Z1231 Encounter for screening mammogram for malignant neoplasm of breast: Secondary | ICD-10-CM

## 2024-11-17 ENCOUNTER — Ambulatory Visit: Admission: RE | Admit: 2024-11-17 | Source: Ambulatory Visit

## 2024-11-17 DIAGNOSIS — Z1231 Encounter for screening mammogram for malignant neoplasm of breast: Secondary | ICD-10-CM

## 2025-01-04 ENCOUNTER — Ambulatory Visit: Admitting: Dermatology

## 2025-01-23 ENCOUNTER — Encounter: Admitting: Internal Medicine

## 2025-04-02 ENCOUNTER — Encounter: Admitting: Internal Medicine
